# Patient Record
Sex: Female | Born: 1950 | Race: White | Hispanic: No | Smoking: Never smoker
Health system: Southern US, Community
[De-identification: ages and names within clinical notes are randomized; demographics above are authoritative.]

## PROBLEM LIST (undated history)

## (undated) DIAGNOSIS — K2270 Barrett's esophagus without dysplasia: Secondary | ICD-10-CM

## (undated) DIAGNOSIS — Z8719 Personal history of other diseases of the digestive system: Secondary | ICD-10-CM

## (undated) DIAGNOSIS — M1990 Unspecified osteoarthritis, unspecified site: Secondary | ICD-10-CM

## (undated) DIAGNOSIS — I10 Essential (primary) hypertension: Secondary | ICD-10-CM

## (undated) DIAGNOSIS — F419 Anxiety disorder, unspecified: Secondary | ICD-10-CM

## (undated) DIAGNOSIS — R011 Cardiac murmur, unspecified: Secondary | ICD-10-CM

## (undated) DIAGNOSIS — N189 Chronic kidney disease, unspecified: Secondary | ICD-10-CM

## (undated) DIAGNOSIS — F329 Major depressive disorder, single episode, unspecified: Secondary | ICD-10-CM

## (undated) DIAGNOSIS — F32A Depression, unspecified: Secondary | ICD-10-CM

## (undated) DIAGNOSIS — E079 Disorder of thyroid, unspecified: Secondary | ICD-10-CM

## (undated) DIAGNOSIS — K219 Gastro-esophageal reflux disease without esophagitis: Secondary | ICD-10-CM

## (undated) DIAGNOSIS — G43909 Migraine, unspecified, not intractable, without status migrainosus: Secondary | ICD-10-CM

## (undated) DIAGNOSIS — J189 Pneumonia, unspecified organism: Secondary | ICD-10-CM

## (undated) HISTORY — PX: ROTATOR CUFF REPAIR: SHX139

## (undated) HISTORY — PX: TONSILLECTOMY: SUR1361

## (undated) HISTORY — PX: EYE SURGERY: SHX253

## (undated) HISTORY — PX: JOINT REPLACEMENT: SHX530

## (undated) HISTORY — PX: REPLACEMENT TOTAL KNEE: SUR1224

---

## 2008-01-15 ENCOUNTER — Encounter: Payer: Self-pay | Admitting: Endocrinology

## 2008-03-17 ENCOUNTER — Ambulatory Visit: Payer: Self-pay | Admitting: Endocrinology

## 2008-03-17 DIAGNOSIS — F329 Major depressive disorder, single episode, unspecified: Secondary | ICD-10-CM

## 2008-03-17 DIAGNOSIS — J45909 Unspecified asthma, uncomplicated: Secondary | ICD-10-CM | POA: Insufficient documentation

## 2008-03-17 DIAGNOSIS — R011 Cardiac murmur, unspecified: Secondary | ICD-10-CM

## 2008-03-17 DIAGNOSIS — E785 Hyperlipidemia, unspecified: Secondary | ICD-10-CM

## 2008-03-17 DIAGNOSIS — E0590 Thyrotoxicosis, unspecified without thyrotoxic crisis or storm: Secondary | ICD-10-CM | POA: Insufficient documentation

## 2008-03-17 DIAGNOSIS — K219 Gastro-esophageal reflux disease without esophagitis: Secondary | ICD-10-CM

## 2008-03-25 ENCOUNTER — Encounter: Payer: Self-pay | Admitting: Endocrinology

## 2008-04-02 ENCOUNTER — Telehealth (INDEPENDENT_AMBULATORY_CARE_PROVIDER_SITE_OTHER): Payer: Self-pay | Admitting: *Deleted

## 2008-04-04 ENCOUNTER — Encounter (INDEPENDENT_AMBULATORY_CARE_PROVIDER_SITE_OTHER): Payer: Self-pay | Admitting: *Deleted

## 2008-04-07 ENCOUNTER — Telehealth: Payer: Self-pay | Admitting: Endocrinology

## 2008-04-23 ENCOUNTER — Encounter: Payer: Self-pay | Admitting: Endocrinology

## 2008-04-28 ENCOUNTER — Ambulatory Visit: Payer: Self-pay | Admitting: Endocrinology

## 2008-04-28 DIAGNOSIS — E042 Nontoxic multinodular goiter: Secondary | ICD-10-CM

## 2008-06-02 ENCOUNTER — Ambulatory Visit: Payer: Self-pay | Admitting: Endocrinology

## 2008-06-02 LAB — CONVERTED CEMR LAB
Free T4: 1 ng/dL (ref 0.6–1.6)
TSH: 0.03 microintl units/mL — ABNORMAL LOW (ref 0.35–5.50)

## 2008-06-25 ENCOUNTER — Telehealth: Payer: Self-pay | Admitting: Endocrinology

## 2008-08-05 ENCOUNTER — Ambulatory Visit: Payer: Self-pay | Admitting: Endocrinology

## 2008-08-05 LAB — CONVERTED CEMR LAB
Free T4: 0.6 ng/dL (ref 0.6–1.6)
TSH: 0.55 microintl units/mL (ref 0.35–5.50)

## 2008-11-03 ENCOUNTER — Ambulatory Visit: Payer: Self-pay | Admitting: Endocrinology

## 2009-02-16 ENCOUNTER — Ambulatory Visit: Payer: Self-pay | Admitting: Endocrinology

## 2009-02-20 ENCOUNTER — Telehealth (INDEPENDENT_AMBULATORY_CARE_PROVIDER_SITE_OTHER): Payer: Self-pay | Admitting: *Deleted

## 2009-07-10 ENCOUNTER — Encounter: Payer: Self-pay | Admitting: Endocrinology

## 2009-08-17 ENCOUNTER — Ambulatory Visit: Payer: Self-pay | Admitting: Endocrinology

## 2009-08-17 ENCOUNTER — Telehealth (INDEPENDENT_AMBULATORY_CARE_PROVIDER_SITE_OTHER): Payer: Self-pay | Admitting: *Deleted

## 2010-02-18 ENCOUNTER — Ambulatory Visit: Payer: Self-pay | Admitting: Endocrinology

## 2010-11-09 NOTE — Assessment & Plan Note (Signed)
Summary: 6 MTH FU  STC---RS'D/CD   Vital Signs:  Patient profile:   60 year old female Height:      60 inches (152.40 cm) Weight:      237.25 pounds (107.84 kg) BMI:     46.50 O2 Sat:      98 % on Room air Temp:     97.1 degrees F (36.17 degrees C) oral Pulse rate:   72 / minute BP sitting:   108 / 60  (left arm) Cuff size:   large  Vitals Entered By: Josph Macho RMA (Feb 18, 2010 10:41 AM)  O2 Flow:  Room air CC: 6 month follow up/ pt states she is no longer taking Nexium, Darvocet, Naproxen, Toviaz, or bystolic/ pt states she has developed Ulcers since her last visit/ CF   Primary Provider:  furr  CC:  6 month follow up/ pt states she is no longer taking Nexium, Darvocet, Naproxen, Toviaz, and or bystolic/ pt states she has developed Ulcers since her last visit/ CF.  History of Present Illness: pt states she has anxiety and insomnia.  she wants to reconsider i-131 rx.  she did not have enough uptake on scan in the past, but she would like to revisit.    Current Medications (verified): 1)  Singulair 10 Mg  Tabs (Montelukast Sodium) .... Take 1 By Mouth Qd 2)  Nexium 40 Mg  Cpdr (Esomeprazole Magnesium) .... Take 1 By Mouth Qd 3)  Lexapro 20 Mg  Tabs (Escitalopram Oxalate) .... Take 1 By Mouth Qd 4)  Actonel 150 Mg  Tabs (Risedronate Sodium) .... Take 1 By Mouth Q Month 5)  Darvocet-N 100 100-650 Mg  Tabs (Propoxyphene N-Apap) .... Take Prn 6)  Advair Diskus 500-50 Mcg/dose  Misc (Fluticasone-Salmeterol) .... Take 1 Puff Two Times A Day 7)  Tapazole 10 Mg  Tabs (Methimazole) .... Qd 8)  Naproxen 500 Mg Tabs (Naproxen) .... Take 1 By Mouth Two Times A Day 9)  Toviaz 8 Mg Xr24h-Tab (Fesoterodine Fumarate) .Marland Kitchen.. 1 Qd 10)  Bystolic 5 Mg Tabs (Nebivolol Hcl) .Marland Kitchen.. 1 Daily 11)  Lisinopril 10 Mg Tabs (Lisinopril) .... Once Daily 12)  Aciphex 20 Mg Tbec (Rabeprazole Sodium) .... Daily 13)  Carafate .Marland Kitchen.. 10 Mg Three Times A Day 14)  Vicodin 5-500 Mg Tabs (Hydrocodone-Acetaminophen)  .... 1/2 Tab Q 4 Hrs As Needed 15)  Fish Oil 16)  Osteo Bi-Flex .... Daily 17)  Aspirin 81mg  .... Daily 18)  Zyrtec .... As Needed  Allergies (verified): No Known Drug Allergies  Past History:  Past Medical History: Last updated: 03/17/2008 Asthma Depression GERD Hyperlipidemia  Review of Systems  The patient denies depression.    Physical Exam  General:  normal appearance.   Neck:  i do not appreciate a goiter. Additional Exam:  FastTSH                   0.46 uIU/mL                 0.35-5.50 Free T4                   0.9 ng/dL                   6.2-9.5    Impression & Recommendations:  Problem # 1:  GOITER, MULTINODULAR (ICD-241.1) Assessment Unchanged  Problem # 2:  HYPERTHYROIDISM (ICD-242.90) due to #1  Medications Added to Medication List This Visit: 1)  Lisinopril 10 Mg Tabs (Lisinopril) .Marland KitchenMarland KitchenMarland Kitchen  Once daily 2)  Aciphex 20 Mg Tbec (Rabeprazole sodium) .... Daily 3)  Carafate  .Marland Kitchen.. 10 mg three times a day 4)  Vicodin 5-500 Mg Tabs (Hydrocodone-acetaminophen) .... 1/2 tab q 4 hrs as needed 5)  Fish Oil  6)  Osteo Bi-flex  .... Daily 7)  Aspirin 81mg   .... Daily 8)  Zyrtec  .... As needed  Other Orders: TLB-TSH (Thyroid Stimulating Hormone) (84443-TSH) TLB-T4 (Thyrox), Free [phone]) Est. Patient Level III (91478)  Patient Instructions: 1)  tests are being ordered for you today.  a few days after the test(s), please call [phone] to hear your test results. 2)  pending the test results, please stop methimazole. 3)  when you thyroid becomes overactive again off the medication, plan will be to recheck the "scan," to see if the radioactive iodine is feasable. 4)  (update: i left message on phone-tree:  stay-off tapazole.  go to lab in 1 month for tsh and free t4.  242.9)

## 2011-10-07 ENCOUNTER — Encounter (HOSPITAL_COMMUNITY): Payer: Self-pay | Admitting: Certified Registered Nurse Anesthetist

## 2011-10-07 ENCOUNTER — Emergency Department (HOSPITAL_COMMUNITY): Payer: Managed Care, Other (non HMO) | Admitting: Certified Registered Nurse Anesthetist

## 2011-10-07 ENCOUNTER — Emergency Department (HOSPITAL_COMMUNITY): Payer: Managed Care, Other (non HMO)

## 2011-10-07 ENCOUNTER — Inpatient Hospital Stay (HOSPITAL_COMMUNITY)
Admission: EM | Admit: 2011-10-07 | Discharge: 2011-10-09 | DRG: 512 | Disposition: A | Discharge status: Home / Self Care | Payer: Managed Care, Other (non HMO) | Attending: Orthopaedic Surgery | Admitting: Orthopaedic Surgery

## 2011-10-07 ENCOUNTER — Ambulatory Visit: Admit: 2011-10-07 | Payer: Self-pay | Admitting: Orthopaedic Surgery

## 2011-10-07 ENCOUNTER — Encounter (HOSPITAL_COMMUNITY)
Admission: EM | Disposition: A | Discharge status: Home / Self Care | Payer: Self-pay | Source: Home / Self Care | Attending: Orthopaedic Surgery

## 2011-10-07 DIAGNOSIS — S52609B Unspecified fracture of lower end of unspecified ulna, initial encounter for open fracture type I or II: Secondary | ICD-10-CM

## 2011-10-07 DIAGNOSIS — S52201B Unspecified fracture of shaft of right ulna, initial encounter for open fracture type I or II: Secondary | ICD-10-CM | POA: Diagnosis present

## 2011-10-07 DIAGNOSIS — Y998 Other external cause status: Secondary | ICD-10-CM

## 2011-10-07 DIAGNOSIS — W540XXA Bitten by dog, initial encounter: Secondary | ICD-10-CM | POA: Diagnosis present

## 2011-10-07 DIAGNOSIS — I10 Essential (primary) hypertension: Secondary | ICD-10-CM | POA: Diagnosis present

## 2011-10-07 DIAGNOSIS — Y92009 Unspecified place in unspecified non-institutional (private) residence as the place of occurrence of the external cause: Secondary | ICD-10-CM

## 2011-10-07 DIAGNOSIS — S51859A Open bite of unspecified forearm, initial encounter: Secondary | ICD-10-CM | POA: Diagnosis present

## 2011-10-07 DIAGNOSIS — Z96659 Presence of unspecified artificial knee joint: Secondary | ICD-10-CM

## 2011-10-07 DIAGNOSIS — E079 Disorder of thyroid, unspecified: Secondary | ICD-10-CM | POA: Diagnosis present

## 2011-10-07 DIAGNOSIS — J45909 Unspecified asthma, uncomplicated: Secondary | ICD-10-CM | POA: Diagnosis present

## 2011-10-07 DIAGNOSIS — K2270 Barrett's esophagus without dysplasia: Secondary | ICD-10-CM | POA: Diagnosis present

## 2011-10-07 DIAGNOSIS — E0590 Thyrotoxicosis, unspecified without thyrotoxic crisis or storm: Secondary | ICD-10-CM | POA: Diagnosis present

## 2011-10-07 DIAGNOSIS — S41112A Laceration without foreign body of left upper arm, initial encounter: Secondary | ICD-10-CM

## 2011-10-07 DIAGNOSIS — S52209B Unspecified fracture of shaft of unspecified ulna, initial encounter for open fracture type I or II: Principal | ICD-10-CM | POA: Diagnosis present

## 2011-10-07 DIAGNOSIS — Z23 Encounter for immunization: Secondary | ICD-10-CM

## 2011-10-07 DIAGNOSIS — S51809A Unspecified open wound of unspecified forearm, initial encounter: Secondary | ICD-10-CM | POA: Diagnosis present

## 2011-10-07 DIAGNOSIS — T07XXXA Unspecified multiple injuries, initial encounter: Secondary | ICD-10-CM

## 2011-10-07 DIAGNOSIS — M19019 Primary osteoarthritis, unspecified shoulder: Secondary | ICD-10-CM | POA: Diagnosis present

## 2011-10-07 HISTORY — DX: Barrett's esophagus without dysplasia: K22.70

## 2011-10-07 HISTORY — PX: ORIF ULNAR FRACTURE: SHX5417

## 2011-10-07 HISTORY — DX: Essential (primary) hypertension: I10

## 2011-10-07 HISTORY — DX: Disorder of thyroid, unspecified: E07.9

## 2011-10-07 HISTORY — PX: I&D EXTREMITY: SHX5045

## 2011-10-07 LAB — CBC
HCT: 13.9 % — ABNORMAL LOW (ref 36.0–46.0)
HCT: 33.3 % — ABNORMAL LOW (ref 36.0–46.0)
Hemoglobin: 4.5 g/dL — CL (ref 12.0–15.0)
Hemoglobin: 10.7 g/dL — ABNORMAL LOW (ref 12.0–15.0)
MCH: 28.3 pg (ref 26.0–34.0)
MCH: 28.2 pg (ref 26.0–34.0)
MCHC: 32.4 g/dL (ref 30.0–36.0)
MCHC: 32.1 g/dL (ref 30.0–36.0)
MCV: 87.4 fL (ref 78.0–100.0)
MCV: 87.6 fL (ref 78.0–100.0)
Platelets: 363 10*3/uL (ref 150–400)
Platelets: 220 10*3/uL (ref 150–400)
RBC: 1.59 MIL/uL — ABNORMAL LOW (ref 3.87–5.11)
RBC: 3.8 MIL/uL — ABNORMAL LOW (ref 3.87–5.11)
RDW: 14.2 % (ref 11.5–15.5)
RDW: 14 % (ref 11.5–15.5)
WBC: 13.8 10*3/uL — ABNORMAL HIGH (ref 4.0–10.5)
WBC: 11 10*3/uL — ABNORMAL HIGH (ref 4.0–10.5)

## 2011-10-07 LAB — BASIC METABOLIC PANEL
BUN: 19 mg/dL (ref 6–23)
CO2: 22 mEq/L (ref 19–32)
Calcium: 8.7 mg/dL (ref 8.4–10.5)
Chloride: 109 mEq/L (ref 96–112)
Creatinine, Ser: 1.2 mg/dL — ABNORMAL HIGH (ref 0.50–1.10)
GFR calc Af Amer: 55 mL/min — ABNORMAL LOW (ref >90)
GFR calc non Af Amer: 47 mL/min — ABNORMAL LOW (ref >90)
Glucose, Bld: 89 mg/dL (ref 70–99)
Potassium: 4 mEq/L (ref 3.5–5.1)
Sodium: 141 mEq/L (ref 135–145)

## 2011-10-07 LAB — PROTIME-INR
INR: 1.03 (ref 0.00–1.49)
Prothrombin Time: 13.7 seconds (ref 11.6–15.2)

## 2011-10-07 LAB — TYPE AND SCREEN
ABO/RH(D): O POS
Antibody Screen: NEGATIVE

## 2011-10-07 LAB — ABO/RH: ABO/RH(D): O POS

## 2011-10-07 SURGERY — OPEN REDUCTION INTERNAL FIXATION (ORIF) ULNAR FRACTURE
Anesthesia: General | Site: Arm Lower | Laterality: Right | Wound class: Dirty or Infected

## 2011-10-07 MED ORDER — OLMESARTAN MEDOXOMIL-HCTZ 20-12.5 MG PO TABS: 1.0000 | ORAL_TABLET | Freq: Every day | ORAL | Status: DC

## 2011-10-07 MED ORDER — ONDANSETRON HCL 4 MG/2ML IJ SOLN
4.0000 mg | Freq: Once | INTRAMUSCULAR | Status: AC
  Administered 2011-10-07: 4 mg via INTRAVENOUS
  Filled 2011-10-07: qty 2

## 2011-10-07 MED ORDER — FENTANYL CITRATE 0.05 MG/ML IJ SOLN
INTRAMUSCULAR | Status: DC | PRN
  Administered 2011-10-07: 100 ug via INTRAVENOUS
  Administered 2011-10-07: 50 ug via INTRAVENOUS

## 2011-10-07 MED ORDER — HYDROMORPHONE HCL PF 1 MG/ML IJ SOLN
1.0000 mg | Freq: Once | INTRAMUSCULAR | Status: AC
  Administered 2011-10-07: 1 mg via INTRAVENOUS
  Filled 2011-10-07 (×2): qty 1

## 2011-10-07 MED ORDER — ONDANSETRON HCL 4 MG/2ML IJ SOLN
INTRAMUSCULAR | Status: AC
  Filled 2011-10-07: qty 2

## 2011-10-07 MED ORDER — PROMETHAZINE HCL 25 MG/ML IJ SOLN: 6.2500 mg | INTRAMUSCULAR | Status: DC | PRN

## 2011-10-07 MED ORDER — MORPHINE SULFATE (PF) 1 MG/ML IV SOLN
INTRAVENOUS | Status: DC
  Administered 2011-10-07 (×2): via INTRAVENOUS
  Administered 2011-10-07: 9 mg via INTRAVENOUS
  Administered 2011-10-08: 23:00:00 via INTRAVENOUS
  Administered 2011-10-08: 6 mg via INTRAVENOUS
  Administered 2011-10-08: 05:00:00 via INTRAVENOUS
  Administered 2011-10-08: 12 mL via INTRAVENOUS
  Administered 2011-10-08: 14:00:00 via INTRAVENOUS
  Administered 2011-10-08: 10.19 mL via INTRAVENOUS
  Administered 2011-10-08: 15 mg via INTRAVENOUS
  Administered 2011-10-08: 33 mg via INTRAVENOUS
  Administered 2011-10-09: 4.35 mg via INTRAVENOUS
  Filled 2011-10-07 (×5): qty 25

## 2011-10-07 MED ORDER — DIPHENHYDRAMINE HCL 12.5 MG/5ML PO ELIX
12.5000 mg | ORAL_SOLUTION | ORAL | Status: DC | PRN
  Filled 2011-10-07: qty 10

## 2011-10-07 MED ORDER — CEFAZOLIN SODIUM 1-5 GM-% IV SOLN
1.0000 g | Freq: Four times a day (QID) | INTRAVENOUS | Status: DC
  Administered 2011-10-07 – 2011-10-09 (×7): 1 g via INTRAVENOUS
  Filled 2011-10-07 (×8): qty 50

## 2011-10-07 MED ORDER — METOCLOPRAMIDE HCL 5 MG/ML IJ SOLN
5.0000 mg | Freq: Three times a day (TID) | INTRAMUSCULAR | Status: DC | PRN
  Filled 2011-10-07: qty 2

## 2011-10-07 MED ORDER — CLINDAMYCIN PHOSPHATE 900 MG/50ML IV SOLN
900.0000 mg | Freq: Once | INTRAVENOUS | Status: AC
  Administered 2011-10-07: 900 mg via INTRAVENOUS
  Filled 2011-10-07: qty 50

## 2011-10-07 MED ORDER — ESCITALOPRAM OXALATE 20 MG PO TABS
20.0000 mg | ORAL_TABLET | Freq: Every day | ORAL | Status: DC
  Administered 2011-10-07 – 2011-10-08 (×2): 20 mg via ORAL
  Filled 2011-10-07 (×3): qty 1

## 2011-10-07 MED ORDER — SENNOSIDES-DOCUSATE SODIUM 8.6-50 MG PO TABS: 1.0000 | ORAL_TABLET | Freq: Every evening | ORAL | Status: DC | PRN

## 2011-10-07 MED ORDER — HYDROCODONE-ACETAMINOPHEN 5-325 MG PO TABS: 1.0000 | ORAL_TABLET | ORAL | Status: DC | PRN

## 2011-10-07 MED ORDER — DIPHENHYDRAMINE HCL 50 MG/ML IJ SOLN
12.5000 mg | Freq: Four times a day (QID) | INTRAMUSCULAR | Status: DC | PRN
  Filled 2011-10-07: qty 0.25

## 2011-10-07 MED ORDER — NALOXONE HCL 0.4 MG/ML IJ SOLN
0.4000 mg | INTRAMUSCULAR | Status: DC | PRN
  Filled 2011-10-07: qty 1

## 2011-10-07 MED ORDER — METHOCARBAMOL 100 MG/ML IJ SOLN
500.0000 mg | Freq: Four times a day (QID) | INTRAVENOUS | Status: DC | PRN
  Administered 2011-10-07: 500 mg via INTRAVENOUS
  Filled 2011-10-07: qty 5

## 2011-10-07 MED ORDER — SODIUM CHLORIDE 0.9 % IR SOLN
Status: DC | PRN
  Administered 2011-10-07: 1000 mL

## 2011-10-07 MED ORDER — SODIUM CHLORIDE 0.9 % IJ SOLN: 9.0000 mL | INTRAMUSCULAR | Status: DC | PRN

## 2011-10-07 MED ORDER — TETANUS-DIPHTH-ACELL PERTUSSIS 5-2.5-18.5 LF-MCG/0.5 IM SUSP
0.5000 mL | Freq: Once | INTRAMUSCULAR | Status: DC
  Filled 2011-10-07: qty 0.5

## 2011-10-07 MED ORDER — LACTATED RINGERS IV SOLN
INTRAVENOUS | Status: DC | PRN
  Administered 2011-10-07 (×2): via INTRAVENOUS

## 2011-10-07 MED ORDER — TEMAZEPAM 15 MG PO CAPS
30.0000 mg | ORAL_CAPSULE | Freq: Every day | ORAL | Status: DC
Start: 2011-10-07 — End: 2011-10-09
  Administered 2011-10-08 (×2): 30 mg via ORAL
  Filled 2011-10-07 (×2): qty 2

## 2011-10-07 MED ORDER — BUPIVACAINE HCL (PF) 0.25 % IJ SOLN
INTRAMUSCULAR | Status: DC | PRN
  Administered 2011-10-07: 10 mL

## 2011-10-07 MED ORDER — DEXTROSE IN LACTATED RINGERS 5 % IV SOLN
INTRAVENOUS | Status: DC
  Administered 2011-10-07: 13:00:00 via INTRAVENOUS

## 2011-10-07 MED ORDER — ONDANSETRON HCL 4 MG PO TABS
4.0000 mg | ORAL_TABLET | Freq: Four times a day (QID) | ORAL | Status: DC | PRN
  Filled 2011-10-07: qty 1

## 2011-10-07 MED ORDER — MIDAZOLAM HCL 5 MG/5ML IJ SOLN
INTRAMUSCULAR | Status: DC | PRN
  Administered 2011-10-07: 1 mg via INTRAVENOUS

## 2011-10-07 MED ORDER — OLMESARTAN MEDOXOMIL 20 MG PO TABS
20.0000 mg | ORAL_TABLET | Freq: Every day | ORAL | Status: DC
  Administered 2011-10-08 – 2011-10-09 (×2): 20 mg via ORAL
  Filled 2011-10-07 (×2): qty 1

## 2011-10-07 MED ORDER — HYDROMORPHONE HCL PF 1 MG/ML IJ SOLN
0.2500 mg | INTRAMUSCULAR | Status: DC | PRN
  Administered 2011-10-07: 0.5 mg via INTRAVENOUS
  Administered 2011-10-07 (×2): 0.25 mg via INTRAVENOUS

## 2011-10-07 MED ORDER — METHOCARBAMOL 500 MG PO TABS
500.0000 mg | ORAL_TABLET | Freq: Four times a day (QID) | ORAL | Status: DC | PRN
  Administered 2011-10-08 – 2011-10-09 (×2): 500 mg via ORAL
  Filled 2011-10-07 (×3): qty 1

## 2011-10-07 MED ORDER — PHENYLEPHRINE HCL 10 MG/ML IJ SOLN
INTRAMUSCULAR | Status: DC | PRN
  Administered 2011-10-07 (×2): 40 ug via INTRAVENOUS

## 2011-10-07 MED ORDER — DOCUSATE SODIUM 100 MG PO CAPS
100.0000 mg | ORAL_CAPSULE | Freq: Two times a day (BID) | ORAL | Status: DC
  Administered 2011-10-08 – 2011-10-09 (×4): 100 mg via ORAL
  Filled 2011-10-07 (×5): qty 1

## 2011-10-07 MED ORDER — MEPERIDINE HCL 25 MG/ML IJ SOLN: 6.2500 mg | INTRAMUSCULAR | Status: DC | PRN

## 2011-10-07 MED ORDER — EPHEDRINE SULFATE 50 MG/ML IJ SOLN
INTRAMUSCULAR | Status: DC | PRN
  Administered 2011-10-07: 5 mg via INTRAVENOUS

## 2011-10-07 MED ORDER — ONDANSETRON HCL 4 MG/2ML IJ SOLN
INTRAMUSCULAR | Status: DC | PRN
  Administered 2011-10-07: 4 mg via INTRAVENOUS

## 2011-10-07 MED ORDER — ONDANSETRON HCL 4 MG/2ML IJ SOLN
4.0000 mg | Freq: Four times a day (QID) | INTRAMUSCULAR | Status: DC | PRN
  Filled 2011-10-07: qty 2

## 2011-10-07 MED ORDER — HYDROMORPHONE HCL PF 1 MG/ML IJ SOLN
1.0000 mg | Freq: Once | INTRAMUSCULAR | Status: AC
  Administered 2011-10-07: 1 mg via INTRAVENOUS
  Filled 2011-10-07: qty 1

## 2011-10-07 MED ORDER — KCL IN DEXTROSE-NACL 20-5-0.45 MEQ/L-%-% IV SOLN
INTRAVENOUS | Status: DC
  Administered 2011-10-07 – 2011-10-08 (×2): via INTRAVENOUS
  Filled 2011-10-07 (×3): qty 1000

## 2011-10-07 MED ORDER — OXYCODONE-ACETAMINOPHEN 5-325 MG PO TABS: 1.0000 | ORAL_TABLET | ORAL | Status: DC | PRN

## 2011-10-07 MED ORDER — CEFAZOLIN SODIUM-DEXTROSE 2-3 GM-% IV SOLR
2.0000 g | INTRAVENOUS | Status: AC
  Administered 2011-10-07: 2 g via INTRAVENOUS
  Filled 2011-10-07: qty 50

## 2011-10-07 MED ORDER — SODIUM CHLORIDE 0.9 % IV SOLN
Freq: Once | INTRAVENOUS | Status: AC
  Administered 2011-10-07: 09:00:00 via INTRAVENOUS

## 2011-10-07 MED ORDER — ONDANSETRON HCL 4 MG/2ML IJ SOLN
4.0000 mg | Freq: Four times a day (QID) | INTRAMUSCULAR | Status: DC | PRN
  Administered 2011-10-07: 4 mg via INTRAVENOUS
  Filled 2011-10-07: qty 2

## 2011-10-07 MED ORDER — DIPHENHYDRAMINE HCL 12.5 MG/5ML PO ELIX
12.5000 mg | ORAL_SOLUTION | Freq: Four times a day (QID) | ORAL | Status: DC | PRN
  Filled 2011-10-07: qty 5

## 2011-10-07 MED ORDER — METOCLOPRAMIDE HCL 10 MG PO TABS
5.0000 mg | ORAL_TABLET | Freq: Three times a day (TID) | ORAL | Status: DC | PRN
  Administered 2011-10-08: 10 mg via ORAL

## 2011-10-07 MED ORDER — PANTOPRAZOLE SODIUM 40 MG PO TBEC
40.0000 mg | DELAYED_RELEASE_TABLET | Freq: Every day | ORAL | Status: DC
  Administered 2011-10-07: 40 mg via ORAL
  Filled 2011-10-07: qty 1

## 2011-10-07 MED ORDER — LORAZEPAM 0.5 MG PO TABS
0.5000 mg | ORAL_TABLET | Freq: Four times a day (QID) | ORAL | Status: DC | PRN
  Administered 2011-10-08: 0.5 mg via ORAL
  Filled 2011-10-07: qty 1

## 2011-10-07 MED ORDER — HYDROCHLOROTHIAZIDE 12.5 MG PO CAPS
12.5000 mg | ORAL_CAPSULE | Freq: Every day | ORAL | Status: DC
  Administered 2011-10-08 – 2011-10-09 (×2): 12.5 mg via ORAL
  Filled 2011-10-07 (×2): qty 1

## 2011-10-07 MED ORDER — PROPOFOL 10 MG/ML IV EMUL
INTRAVENOUS | Status: DC | PRN
  Administered 2011-10-07: 150 mg via INTRAVENOUS

## 2011-10-07 MED ORDER — BISACODYL 10 MG RE SUPP: 10.0000 mg | Freq: Every day | RECTAL | Status: DC | PRN

## 2011-10-07 SURGICAL SUPPLY — 76 items
BAG DECANTER FOR FLEXI CONT (MISCELLANEOUS) IMPLANT
BANDAGE ACE 4 STERILE (GAUZE/BANDAGES/DRESSINGS) ×2 IMPLANT
BANDAGE ELASTIC 4 VELCRO ST LF (GAUZE/BANDAGES/DRESSINGS) ×2 IMPLANT
BANDAGE GAUZE ELAST BULKY 4 IN (GAUZE/BANDAGES/DRESSINGS) ×2 IMPLANT
BIT DRILL 2 FAST STEP (BIT) ×2 IMPLANT
BIT DRILL 2 MINI QC DISP (BIT) ×2 IMPLANT
BNDG COHESIVE 4X5 TAN STRL (GAUZE/BANDAGES/DRESSINGS) ×2 IMPLANT
BNDG ESMARK 4X9 LF (GAUZE/BANDAGES/DRESSINGS) ×2 IMPLANT
CLOTH BEACON ORANGE TIMEOUT ST (SAFETY) ×2 IMPLANT
CORDS BIPOLAR (ELECTRODE) ×2 IMPLANT
COVER SURGICAL LIGHT HANDLE (MISCELLANEOUS) ×2 IMPLANT
CUFF TOURNIQUET SINGLE 18IN (TOURNIQUET CUFF) ×2 IMPLANT
DRAPE OEC MINIVIEW 54X84 (DRAPES) ×2 IMPLANT
DRSG EMULSION OIL 3X3 NADH (GAUZE/BANDAGES/DRESSINGS) IMPLANT
DRSG PAD ABDOMINAL 8X10 ST (GAUZE/BANDAGES/DRESSINGS) ×4 IMPLANT
DURAPREP 26ML APPLICATOR (WOUND CARE) ×2 IMPLANT
ELECT REM PT RETURN 9FT ADLT (ELECTROSURGICAL) ×2
ELECTRODE REM PT RTRN 9FT ADLT (ELECTROSURGICAL) ×1 IMPLANT
GAUZE XEROFORM 1X8 LF (GAUZE/BANDAGES/DRESSINGS) ×2 IMPLANT
GAUZE XEROFORM 5X9 LF (GAUZE/BANDAGES/DRESSINGS) ×2 IMPLANT
GLOVE BIO SURGEON STRL SZ8.5 (GLOVE) ×4 IMPLANT
GLOVE BIOGEL PI IND STRL 7.5 (GLOVE) ×1 IMPLANT
GLOVE BIOGEL PI IND STRL 8 (GLOVE) ×1 IMPLANT
GLOVE BIOGEL PI INDICATOR 7.5 (GLOVE) ×1
GLOVE BIOGEL PI INDICATOR 8 (GLOVE) ×1
GLOVE ECLIPSE 7.0 STRL STRAW (GLOVE) IMPLANT
GLOVE ORTHO TXT STRL SZ7.5 (GLOVE) ×2 IMPLANT
GOWN PREVENTION PLUS LG XLONG (DISPOSABLE) IMPLANT
GOWN PREVENTION PLUS XLARGE (GOWN DISPOSABLE) ×4 IMPLANT
GOWN STRL NON-REIN LRG LVL3 (GOWN DISPOSABLE) ×4 IMPLANT
HANDPIECE INTERPULSE COAX TIP (DISPOSABLE) ×1
K-WIRE 1.4X100 (WIRE)
K-WIRE ACE 1.6X6 (WIRE) ×2
KIT BASIN OR (CUSTOM PROCEDURE TRAY) ×2 IMPLANT
KIT ROOM TURNOVER OR (KITS) ×2 IMPLANT
KWIRE 1.4X100 (WIRE) IMPLANT
KWIRE ACE 1.6X6 (WIRE) ×1 IMPLANT
MANIFOLD NEPTUNE II (INSTRUMENTS) ×2 IMPLANT
NEEDLE 22X1 1/2 (OR ONLY) (NEEDLE) IMPLANT
NEEDLE HYPO 25GX1X1/2 BEV (NEEDLE) ×2 IMPLANT
NS IRRIG 1000ML POUR BTL (IV SOLUTION) ×2 IMPLANT
PACK ORTHO EXTREMITY (CUSTOM PROCEDURE TRAY) ×2 IMPLANT
PAD ARMBOARD 7.5X6 YLW CONV (MISCELLANEOUS) ×4 IMPLANT
PAD CAST 4YDX4 CTTN HI CHSV (CAST SUPPLIES) IMPLANT
PADDING CAST COTTON 4X4 STRL (CAST SUPPLIES)
PADDING WEBRIL 4 STERILE (GAUZE/BANDAGES/DRESSINGS) ×2 IMPLANT
PLATE LOCKING 2.5 STRAIGHT (Plate) ×2 IMPLANT
SCREW PEG 15MM (Screw) ×6 IMPLANT
SCREW PEG 2.5X12 NONLOCK (Screw) ×2 IMPLANT
SCREW PEG 2.5X16 NONLOCK (Screw) ×2 IMPLANT
SCREW PEG LOCK 2.5X15 (Orthopedic Implant) ×10 IMPLANT
SCREW PEG LOCK 2.5X16 (Peg) ×2 IMPLANT
SCREWDRIVER BIT 2.0/2.5 127MM (BIT) ×4 IMPLANT
SET HNDPC FAN SPRY TIP SCT (DISPOSABLE) ×1 IMPLANT
SPONGE GAUZE 4X4 12PLY (GAUZE/BANDAGES/DRESSINGS) ×6 IMPLANT
SPONGE LAP 18X18 X RAY DECT (DISPOSABLE) IMPLANT
SPONGE LAP 4X18 X RAY DECT (DISPOSABLE) ×4 IMPLANT
STAPLER VISISTAT 35W (STAPLE) ×2 IMPLANT
STOCKINETTE IMPERVIOUS 9X36 MD (GAUZE/BANDAGES/DRESSINGS) ×2 IMPLANT
STRIP CLOSURE SKIN 1/2X4 (GAUZE/BANDAGES/DRESSINGS) IMPLANT
SUCTION FRAZIER TIP 10 FR DISP (SUCTIONS) ×2 IMPLANT
SUT ETHILON 4 0 PS 2 18 (SUTURE) IMPLANT
SUT PROLENE 3 0 PS 1 (SUTURE) ×2 IMPLANT
SUT VIC AB 2-0 CT1 27 (SUTURE)
SUT VIC AB 2-0 CT1 TAPERPNT 27 (SUTURE) IMPLANT
SUT VIC AB 3-0 X1 27 (SUTURE) ×2 IMPLANT
SUT VICRYL 4-0 PS2 18IN ABS (SUTURE) IMPLANT
SYR CONTROL 10ML LL (SYRINGE) ×2 IMPLANT
TOWEL OR 17X24 6PK STRL BLUE (TOWEL DISPOSABLE) ×2 IMPLANT
TOWEL OR 17X26 10 PK STRL BLUE (TOWEL DISPOSABLE) ×2 IMPLANT
TUBE ANAEROBIC SPECIMEN COL (MISCELLANEOUS) IMPLANT
TUBE CONNECTING 12X1/4 (SUCTIONS) ×2 IMPLANT
UNDERPAD 30X30 INCONTINENT (UNDERPADS AND DIAPERS) ×2 IMPLANT
WASHER 2.5 THREADED (Orthopedic Implant) ×2 IMPLANT
WATER STERILE IRR 1000ML POUR (IV SOLUTION) ×2 IMPLANT
YANKAUER SUCT BULB TIP NO VENT (SUCTIONS) IMPLANT

## 2011-10-07 NOTE — Transfer of Care (Signed)
Immediate Anesthesia Transfer of Care Note  Patient: Melinda Berger  Procedure(s) Performed:  OPEN REDUCTION INTERNAL FIXATION (ORIF) ULNAR FRACTURE; IRRIGATION AND DEBRIDEMENT EXTREMITY  Patient Location: PACU  Anesthesia Type: General  Level of Consciousness: sedated and responds to stimulation  Airway & Oxygen Therapy: Patient Spontanous Breathing and Patient connected to nasal cannula oxygen  Post-op Assessment: Report given to PACU RN, Post -op Vital signs reviewed and stable and Patient moving all extremities  Post vital signs: Reviewed and stable  Complications: No apparent anesthesia complications

## 2011-10-07 NOTE — Anesthesia Postprocedure Evaluation (Signed)
  Anesthesia Post-op Note  Patient: Melinda Berger  Procedure(s) Performed:  OPEN REDUCTION INTERNAL FIXATION (ORIF) ULNAR FRACTURE; IRRIGATION AND DEBRIDEMENT EXTREMITY  Patient Location: PACU  Anesthesia Type: General  Level of Consciousness: awake, alert  and oriented  Airway and Oxygen Therapy: Patient Spontanous Breathing and Patient connected to nasal cannula oxygen  Post-op Pain: mild  Post-op Assessment: Post-op Vital signs reviewed and Patient's Cardiovascular Status Stable  Post-op Vital Signs: stable  Complications: No apparent anesthesia complications

## 2011-10-07 NOTE — Anesthesia Procedure Notes (Signed)
Procedure Name: LMA Insertion Date/Time: 10/07/2011 1:40 PM Performed by: Ellin Goodie Pre-anesthesia Checklist: Patient identified, Emergency Drugs available, Suction available, Patient being monitored and Timeout performed Patient Re-evaluated:Patient Re-evaluated prior to inductionOxygen Delivery Method: Circle System Utilized Preoxygenation: Pre-oxygenation with 100% oxygen Intubation Type: IV induction Ventilation: Mask ventilation without difficulty LMA: LMA inserted LMA Size: 4.0 Number of attempts: 1 Placement Confirmation: positive ETCO2 and breath sounds checked- equal and bilateral Tube secured with: Tape Dental Injury: Teeth and Oropharynx as per pre-operative assessment

## 2011-10-07 NOTE — Brief Op Note (Signed)
10/07/2011  3:07 PM  PATIENT:  Melinda Berger  60 y.o. female  PRE-OPERATIVE DIAGNOSIS:  Right ulna shalf open fracture, open traumatic lacerations to right forearm Superficial lacerations to left forearm  POST-OPERATIVE DIAGNOSIS:  Right ulna shalf open fracture, open traumatic lacerations to right forearm Superficial lacerations to left forearm  PROCEDURE:  Procedure(s): OPEN REDUCTION INTERNAL FIXATION (ORIF) ULNAR FRACTURE IRRIGATION AND DEBRIDEMENT EXTREMITY IRRIGATION AND DEBRIDEMENT OF SUPERFICIAL LACERATIONS LEFT FOREARM  SURGEON:  Surgeon(s): Eldred Manges  PHYSICIAN ASSISTANT: Maud Deed PAC  ASSISTANTS: none   ANESTHESIA:   general  EBL:  Total I/O In: 1000 [I.V.:1000] Out: 300 [Urine:300]  BLOOD ADMINISTERED:none  DRAINS: none   LOCAL MEDICATIONS USED:  MARCAINE 10CC  SPECIMEN:  No Specimen  DISPOSITION OF SPECIMEN:  N/A  COUNTS:  YES  TOURNIQUET:  * Missing tourniquet times found for documented tourniquets in log:  16402 *                             Not used  DICTATION: .Note written in EPIC and Other Dictation: Dictation Number [id number]  PLAN OF CARE: Admit to inpatient   PATIENT DISPOSITION:  PACU - hemodynamically stable.   Delay start of Pharmacological VTE agent (>24hrs) due to surgical blood loss or risk of bleeding:  {YES/NO/NOT APPLICABLE:20182

## 2011-10-07 NOTE — Anesthesia Preprocedure Evaluation (Addendum)
Anesthesia Evaluation  Patient identified by MRN, date of birth, ID band Patient awake    Reviewed: Allergy & Precautions, H&P , NPO status , Patient's Chart, lab work & pertinent test results  Airway Mallampati: II TM Distance: >3 FB Neck ROM: full    Dental No notable dental hx. (+) Teeth Intact   Pulmonary neg pulmonary ROS,  clear to auscultation  Pulmonary exam normal       Cardiovascular hypertension, Pt. on medications neg cardio ROS + Valvular Problems/Murmurs regular Normal    Neuro/Psych PSYCHIATRIC DISORDERS Negative Neurological ROS  Negative Psych ROS   GI/Hepatic negative GI ROS, Neg liver ROS, GERD-  ,  Endo/Other  Negative Endocrine ROSHyperthyroidism   Renal/GU negative Renal ROS  Genitourinary negative   Musculoskeletal negative musculoskeletal ROS (+)   Abdominal   Peds negative pediatric ROS (+)  Hematology negative hematology ROS (+)   Anesthesia Other Findings   Reproductive/Obstetrics negative OB ROS                          Anesthesia Physical Anesthesia Plan  ASA: II and Emergent  Anesthesia Plan: General   Post-op Pain Management:    Induction: Intravenous  Airway Management Planned: LMA  Additional Equipment:   Intra-op Plan:   Post-operative Plan: Extubation in OR  Informed Consent: I have reviewed the patients History and Physical, chart, labs and discussed the procedure including the risks, benefits and alternatives for the proposed anesthesia with the patient or authorized representative who has indicated his/her understanding and acceptance.   Dental advisory given  Plan Discussed with: CRNA, Anesthesiologist and Surgeon  Anesthesia Plan Comments:         Anesthesia Quick Evaluation

## 2011-10-07 NOTE — ED Notes (Signed)
Pt reports attempting to break up her 2 [place] and was bit by one of the dogs. Pt has lacerations Right forearm and lacerations to left forearm.

## 2011-10-07 NOTE — Preoperative (Signed)
Beta Blockers   Reason not to administer Beta Blockers:Not Applicable 

## 2011-10-07 NOTE — ED Provider Notes (Signed)
History    60yF with dog bite. Attempting to break up fight between her great danes when was bitten. Laceration to R forearm, deformity and severe pain. No numbness or tingling. Skin tear to L forearm. Says lost a lot of blood prehospital. No other complaints. Denies use of blood thinning medication. No dizziness, lightheadedness or sob. Unsure of tetanus status. Dogs rabies vaccinations current.  CSN: [id number]  Arrival date & time 10/07/11  5621   First MD Initiated Contact with Patient 10/07/11 0818      Chief Complaint  Patient presents with  . Animal Bite  . Laceration    (Consider location/radiation/quality/duration/timing/severity/associated sxs/prior treatment) HPI  Past Medical History  Diagnosis Date  . Hypertension   . Asthma   . Barrett esophagus   . Thyroid disease     Past Surgical History  Procedure Date  . Tonsillectomy     History reviewed. No pertinent family history.  History  Substance Use Topics  . Smoking status: Never Smoker   . Smokeless tobacco: Never Used  . Alcohol Use: Yes    OB History    Grav Para Term Preterm Abortions TAB SAB Ect Mult Living                  Review of Systems   Review of symptoms negative unless otherwise noted in HPI.   Allergies  Ambien and Nsaids  Home Medications   Current Outpatient Rx  Name Route Sig Dispense Refill  . ESCITALOPRAM OXALATE 20 MG PO TABS Oral Take 20 mg by mouth at bedtime.      Marland Kitchen HYDROCODONE-ACETAMINOPHEN 5-325 MG PO TABS Oral Take 1 tablet by mouth every 6 (six) hours as needed. For pain     . LORAZEPAM 0.5 MG PO TABS Oral Take 0.5 mg by mouth every 6 (six) hours as needed. For anxiety     . OLMESARTAN MEDOXOMIL-HCTZ 20-12.5 MG PO TABS Oral Take 1 tablet by mouth daily.      Marland Kitchen RABEPRAZOLE SODIUM 20 MG PO TBEC Oral Take 20 mg by mouth daily.      Marland Kitchen TEMAZEPAM 30 MG PO CAPS Oral Take 30 mg by mouth at bedtime.        BP 113/37  Pulse 79  Temp(Src) 98 F (36.7 C) (Oral)  Resp  20  SpO2 100%  Physical Exam  Nursing note and vitals reviewed. Constitutional: She appears well-developed and well-nourished.       Sitting up in bed supporting R forearm with L hand  HENT:  Head: Normocephalic and atraumatic.  Eyes: Conjunctivae are normal. Right eye exhibits no discharge. Left eye exhibits no discharge.  Neck: Neck supple.  Cardiovascular: Normal rate, regular rhythm and normal heart sounds.  Exam reveals no gallop and no friction rub.   No murmur heard. Pulmonary/Chest: Effort normal and breath sounds normal. No respiratory distress.  Abdominal: Soft. She exhibits no distension. There is no tenderness.  Musculoskeletal:       Severe tenderness along distal R forearm. Neurovascularly intact distally.  Neurological: She is alert.  Skin: Skin is warm and dry. She is not diaphoretic.       8 cm roughly linear laceration with irregular margins to dorsal aspect of mid R forearm. 6 cm laceration to ulnar aspect distal R forearm. No active bleeding. Subcutaneous fat visualized. Multiple skin tears to L forearm.  Psychiatric: Thought content normal.       Slightly anxious    ED Course  Procedures (including critical  care time)  Labs Reviewed  CBC - Abnormal; Notable for the following:    WBC 13.8 (*)    RBC 1.59 (*)    Hemoglobin 4.5 (*)    HCT 13.9 (*)    All other components within normal limits  BASIC METABOLIC PANEL - Abnormal; Notable for the following:    Creatinine, Ser 1.20 (*)    GFR calc non Af Amer 47 (*)    GFR calc Af Amer 55 (*)    All other components within normal limits  CBC - Abnormal; Notable for the following:    WBC 11.0 (*)    RBC 3.80 (*)    Hemoglobin 10.7 (*) DELTA CHECK NOTED   HCT 33.3 (*)    All other components within normal limits  PROTIME-INR  TYPE AND SCREEN  ABO/RH  LAB REPORT - SCANNED   Dg Chest 2 View  10/07/2011  *RADIOLOGY REPORT*  Clinical Data: Preoperative respiratory exam for arm fracture  CHEST - 2 VIEW   Comparison: None.  Findings: Heart size is normal.  There is a large hiatal hernia. The lungs show prominent interstitial markings which are likely chronic.  No sign of active infiltrate, mass, effusion or collapse. No acute bony finding.  IMPRESSION: Hiatal hernia.  Chronic appearing interstitial lung markings.  Original Report Authenticated By: Thomasenia Sales, M.D.   Dg Forearm Right  10/07/2011  *RADIOLOGY REPORT*  Clinical Data: bitten by dog, evaluate for fracture  RIGHT FOREARM - 2 VIEW  Comparison: Right wrist radiographs - earlier same day  Findings: There is a comminuted minimally displaced fracture of the distal diaphysis of the ulna.  This is associated with adjacent subcutaneous edema.  No definite radiopaque foreign body.  Punctate calcifications are favored to be dermal in etiology.  There is mild subluxation of the distal radial ulnar joint.  IMPRESSION: 1.  Comminuted, minimally displaced fracture the distal diaphysis of the ulna. 2.  Extensive subcutaneous edema about the fracture site without discrete radiopaque foreign body.  Original Report Authenticated By: Waynard Reeds, M.D.   Dg Wrist Complete Right  10/07/2011  *RADIOLOGY REPORT*  Clinical Data: Bitten by dog, evaluate for fracture  RIGHT WRIST - COMPLETE 3+ VIEW  Comparison: None.  Findings:  There is a comminuted, displaced fracture of the distal diaphysis of the ulna with volar angulation and minimal overriding of the fracture fragments.  There is minimal subluxation of the distal radial ulnar joint.   There is an extensive amount of adjacent subcutaneous swelling.  No subcutaneous emphysema or radiopaque foreign body.  Punctate calcifications within the mid aspect of the arm may be dermal in etiology.  IMPRESSION: 1.  Comminuted, displaced fracture of the distal diaphysis of the ulna with subluxation of the distal radial ulnar joint.  2.  Extensive subcutaneous edema about the fracture site without discrete radiopaque foreign  body.  Original Report Authenticated By: Waynard Reeds, M.D.   9:56 AM XR reviewed by myself. Comminuted distal ulnar fx. Hand surgery paged. 10:10 AM Discussed case with Yates, hand surgery. Will review films and eval pt in ED.  1. Open fracture of distal ulna   2. Dog bite   3. Lacerations of multiple sites of left arm   4. Abrasions of multiple sites       MDM  60yF with dog bites. Multiple wounds. Open fx to R ulna. Discussed with ortho and to take to OR for washout and ORIF. Dose abx in ED, pain  meds. Last tetanus in 2009.       Raeford Razor, MD 10/12/11 1415

## 2011-10-07 NOTE — ED Notes (Signed)
Received call from lab Re: HGB 4.5 results given to Dr. Juleen China. Pt out of rm for testing.

## 2011-10-07 NOTE — H&P (Signed)
Melinda Berger is an 60 y.o. female.   Chief Complaint: Dog bite to right forearm HPI: Pt report she was breaking up a fight between two female [place] which are her dogs.  One dog bit her right forearm and she sustained an open comminuted fracture to the right ulna.  Multiple small abrasions to the left forearm without fracture. Pt treated in ED with wound care and IV Clindamycin.  Admitted for ORIF of open ulna fracture by Dr Ophelia Charter. Last tetanus 2009 per pt.  Pt indicated the dogs vaccinations are up to date  Of note, Pt has severe OA of right shoulder and plans for TSR at future date.  History of Left total knee replacement. PCP and orthopedist in [facility]  Past Medical History  Diagnosis Date  . Hypertension   . Asthma   . Barrett esophagus   . Thyroid disease     Past Surgical History  Procedure Date  . Tonsillectomy     History reviewed. No pertinent family history. Social History:  reports that she has never smoked. She has never used smokeless tobacco. She reports that she drinks alcohol. She reports that she does not use illicit drugs.  Allergies:  Allergies  Allergen Reactions  . Ambien Other (See Comments)    Hallucinations   . Nsaids     Due to barret esophagus    Medications Prior to Admission  Medication Dose Route Frequency Provider Last Rate Last Dose  . 0.9 %  sodium chloride infusion   Intravenous Once Raeford Razor, MD      . clindamycin (CLEOCIN) IVPB 900 mg  900 mg Intravenous Once Raeford Razor, MD   900 mg at 10/07/11 1030  . HYDROmorphone (DILAUDID) injection 1 mg  1 mg Intravenous Once Raeford Razor, MD   1 mg at 10/07/11 0841  . HYDROmorphone (DILAUDID) injection 1 mg  1 mg Intravenous Once Raeford Razor, MD   1 mg at 10/07/11 0902  . HYDROmorphone (DILAUDID) injection 1 mg  1 mg Intravenous Once Raeford Razor, MD   1 mg at 10/07/11 1042  . ondansetron (ZOFRAN) injection 4 mg  4 mg Intravenous Once Raeford Razor, MD   4 mg at 10/07/11 0840  .  DISCONTD: TDaP (BOOSTRIX) injection 0.5 mL  0.5 mL Intramuscular Once Raeford Razor, MD       No current outpatient prescriptions on file as of 10/07/2011.    Results for orders placed during the hospital encounter of 10/07/11 (from the past 48 hour(s))  CBC     Status: Abnormal   Collection Time   10/07/11  8:53 AM      Component Value Range Comment   WBC 13.8 (*) 4.0 - 10.5 (K/uL)    RBC 1.59 (*) 3.87 - 5.11 (MIL/uL)    Hemoglobin 4.5 (*) 12.0 - 15.0 (g/dL)    HCT 16.1 (*) 09.6 - 46.0 (%)    MCV 87.4  78.0 - 100.0 (fL)    MCH 28.3  26.0 - 34.0 (pg)    MCHC 32.4  30.0 - 36.0 (g/dL)    RDW 04.5  40.9 - 81.1 (%)    Platelets 363  150 - 400 (K/uL)   BASIC METABOLIC PANEL     Status: Abnormal   Collection Time   10/07/11  8:53 AM      Component Value Range Comment   Sodium 141  135 - 145 (mEq/L)    Potassium 4.0  3.5 - 5.1 (mEq/L)    Chloride 109  96 - 112 (mEq/L)    CO2 22  19 - 32 (mEq/L)    Glucose, Bld 89  70 - 99 (mg/dL)    BUN 19  6 - 23 (mg/dL)    Creatinine, Ser 0.45 (*) 0.50 - 1.10 (mg/dL)    Calcium 8.7  8.4 - 10.5 (mg/dL)    GFR calc non Af Amer 47 (*) >90 (mL/min)    GFR calc Af Amer 55 (*) >90 (mL/min)   CBC     Status: Abnormal   Collection Time   10/07/11  9:56 AM      Component Value Range Comment   WBC 11.0 (*) 4.0 - 10.5 (K/uL)    RBC 3.80 (*) 3.87 - 5.11 (MIL/uL)    Hemoglobin 10.7 (*) 12.0 - 15.0 (g/dL) DELTA CHECK NOTED   HCT 33.3 (*) 36.0 - 46.0 (%)    MCV 87.6  78.0 - 100.0 (fL)    MCH 28.2  26.0 - 34.0 (pg)    MCHC 32.1  30.0 - 36.0 (g/dL)    RDW 40.9  81.1 - 91.4 (%)    Platelets 220  150 - 400 (K/uL)   PROTIME-INR     Status: Normal   Collection Time   10/07/11  9:56 AM      Component Value Range Comment   Prothrombin Time 13.7  11.6 - 15.2 (seconds)    INR 1.03  0.00 - 1.49     Dg Forearm Right  10/07/2011  *RADIOLOGY REPORT*  Clinical Data: bitten by dog, evaluate for fracture  RIGHT FOREARM - 2 VIEW  Comparison: Right wrist radiographs  - earlier same day  Findings: There is a comminuted minimally displaced fracture of the distal diaphysis of the ulna.  This is associated with adjacent subcutaneous edema.  No definite radiopaque foreign body.  Punctate calcifications are favored to be dermal in etiology.  There is mild subluxation of the distal radial ulnar joint.  IMPRESSION: 1.  Comminuted, minimally displaced fracture the distal diaphysis of the ulna. 2.  Extensive subcutaneous edema about the fracture site without discrete radiopaque foreign body.  Original Report Authenticated By: Waynard Reeds, M.D.   Dg Wrist Complete Right  10/07/2011  *RADIOLOGY REPORT*  Clinical Data: Bitten by dog, evaluate for fracture  RIGHT WRIST - COMPLETE 3+ VIEW  Comparison: None.  Findings:  There is a comminuted, displaced fracture of the distal diaphysis of the ulna with volar angulation and minimal overriding of the fracture fragments.  There is minimal subluxation of the distal radial ulnar joint.   There is an extensive amount of adjacent subcutaneous swelling.  No subcutaneous emphysema or radiopaque foreign body.  Punctate calcifications within the mid aspect of the arm may be dermal in etiology.  IMPRESSION: 1.  Comminuted, displaced fracture of the distal diaphysis of the ulna with subluxation of the distal radial ulnar joint.  2.  Extensive subcutaneous edema about the fracture site without discrete radiopaque foreign body.  Original Report Authenticated By: Waynard Reeds, M.D.    Review of Systems  Constitutional: Negative.   HENT: Negative.   Eyes: Negative.   Respiratory: Negative.   Cardiovascular: Negative.   Gastrointestinal: Negative.   Genitourinary: Negative.   Musculoskeletal:       Pain in right forearm  Skin:       Laceration to right arm and abrasions to left arm  Neurological: Positive for tingling.       In fingertips of right hand  Endo/Heme/Allergies:  Negative.   Psychiatric/Behavioral: Negative.     Blood  pressure 148/58, pulse 76, temperature 98.1 F (36.7 C), temperature source Oral, resp. rate 18, SpO2 100.00%. Physical Exam  Constitutional: She appears well-developed and well-nourished. She appears distressed.       Distressed secondary to pain in right forearm  HENT:  Head: Normocephalic and atraumatic.  Eyes: EOM are normal. Pupils are equal, round, and reactive to light.  Neck: Normal range of motion. Neck supple.  Cardiovascular: Normal rate and regular rhythm.   Respiratory: Effort normal and breath sounds normal.  GI: Soft.  Musculoskeletal:       Open fracture of right ulna with laceration of lateral right arm and avulsion of the skin of dorsal forearm.  Deformity of forearm.  Moving fingers right hand well with good cap refill.  Subjective tingling of fingertips right.  Radial and ulnar nerve function intact right  Left forearm with several small abrasions dorsally     Assessment/Plan 1.  Dog bite to the right forearm with open right ulna fracture and large laceration 2.  Superficial abrasions to left forearm  PLAN  To OR for ORIF of open right ulna fracture with appropriate wound care to bilateral forearms.  IV Clindamycin started in ED.  Admit for continued abx and post op care  Etta Gassett M 10/07/2011, 11:27 AM

## 2011-10-08 MED ORDER — NON FORMULARY: 20.0000 mg | Freq: Every day | Status: DC

## 2011-10-08 MED ORDER — SIMETHICONE 80 MG PO CHEW
80.0000 mg | CHEWABLE_TABLET | Freq: Four times a day (QID) | ORAL | Status: DC | PRN
  Administered 2011-10-08: 80 mg via ORAL
  Filled 2011-10-08: qty 1

## 2011-10-08 MED ORDER — OXYCODONE-ACETAMINOPHEN 5-325 MG PO TABS
1.0000 | ORAL_TABLET | ORAL | Status: DC | PRN
  Administered 2011-10-08 – 2011-10-09 (×5): 1 via ORAL
  Filled 2011-10-08 (×5): qty 1

## 2011-10-08 MED ORDER — PNEUMOCOCCAL VAC POLYVALENT 25 MCG/0.5ML IJ INJ
0.5000 mL | INJECTION | INTRAMUSCULAR | Status: AC
  Filled 2011-10-08 (×2): qty 0.5

## 2011-10-08 MED ORDER — SUCRALFATE 1 GM/10ML PO SUSP
1.0000 g | Freq: Two times a day (BID) | ORAL | Status: DC
  Administered 2011-10-08 – 2011-10-09 (×2): 1 g via ORAL
  Filled 2011-10-08 (×3): qty 10

## 2011-10-08 MED ORDER — RABEPRAZOLE SODIUM 20 MG PO TBEC
20.0000 mg | DELAYED_RELEASE_TABLET | Freq: Every day | ORAL | Status: DC
  Administered 2011-10-09: 20 mg via ORAL

## 2011-10-08 NOTE — Progress Notes (Signed)
Occupational Therapy Evaluation Patient Details Name: Melinda Berger MRN: [id number] DOB: 1951/06/13 Today's Date: 10/08/2011  Problem List:  Patient Active Problem List  Diagnoses  . GOITER, MULTINODULAR  . HYPERTHYROIDISM  . HYPERLIPIDEMIA  . DEPRESSION  . ASTHMA  . GERD  . CARDIAC MURMUR  . Barrett's esophagus  . Fracture of ulna, right, open  . Dog bite of forearm  . Multiple abrasions    Past Medical History:  Past Medical History  Diagnosis Date  . Hypertension   . Asthma   . Barrett esophagus   . Thyroid disease    Past Surgical History:  Past Surgical History  Procedure Date  . Tonsillectomy     OT Assessment/Plan/Recommendation OT Assessment Clinical Impression Statement: This 60 y.o. female presents to OT s/p ulnar fx and ORIF and soft tissue wounds bil. UEs which limits bil. UE function.  Pt. demonstrates acute pain, decreased UE function, decrased balance and decreased independence with BADLs.  She has good family support who will assist as needed at D/C.  Recommend OT to maximize safety and independence with BADLS OT Recommendation/Assessment: Patient will need skilled OT in the acute care venue OT Problem List: Decreased strength;Decreased range of motion;Decreased activity tolerance;Impaired balance (sitting and/or standing);Decreased coordination;Impaired UE functional use;Pain Barriers to Discharge: None OT Therapy Diagnosis : Generalized weakness;Acute pain OT Plan OT Frequency: Min 2X/week OT Treatment/Interventions: Self-care/ADL training;Therapeutic exercise;Therapeutic activities;Patient/family education;Balance training OT Recommendation Follow Up Recommendations: Outpatient OT (at MD discretion for Rt UE) Equipment Recommended: None recommended by OT Individuals Consulted Consulted and Agree with Results and Recommendations: Patient OT Goals Acute Rehab OT Goals OT Goal Formulation: With patient Time For Goal Achievement: 7 days ADL  Goals Pt Will Perform Eating: with modified independence;with adaptive utensils ADL Goal: Eating - Progress: Not addressed Pt Will Perform Grooming: with supervision;Standing at sink ADL Goal: Grooming - Progress: Not met Pt Will Perform Upper Body Bathing: with min assist;Unsupported;Sitting, edge of bed ADL Goal: Upper Body Bathing - Progress: Not met Pt Will Perform Lower Body Bathing: with min assist;Sit to stand from bed ADL Goal: Lower Body Bathing - Progress: Not met Pt Will Perform Upper Body Dressing: with set-up;Unsupported;Sitting, bed ADL Goal: Upper Body Dressing - Progress: Not met Pt Will Perform Lower Body Dressing: with min assist;Sit to stand from bed ADL Goal: Lower Body Dressing - Progress: Not met Pt Will Transfer to Toilet: with supervision;Ambulation;Comfort height toilet ADL Goal: Toilet Transfer - Progress: Not met Pt Will Perform Toileting - Clothing Manipulation: with modified independence;Standing ADL Goal: Toileting - Clothing Manipulation - Progress: Not met Additional ADL Goal #1: Pt. will be independent with HEP for bil. UEs ADL Goal: Additional Goal #1 - Progress: Not met  OT Evaluation Precautions/Restrictions  Precautions Precaution Comments: Pt. with ulnar fracture and ORIF Prior Functioning Home Living Lives With: Spouse Bathroom Shower/Tub: Tub/shower unit;None Bathroom Toilet: Handicapped height Additional Comments: Husband and daughter will be available to assist as necessary Prior Function Level of Independence: Independent with basic ADLs;Independent with homemaking with ambulation;Independent with gait;Independent with transfers Able to Take Stairs?: Yes Driving: Yes Vocation: Full time employment Vocation Requirements: Pt is an Charity fundraiser with [employer] Comments: Pt is Rt hand dominant ADL ADL Eating/Feeding: Performed;Modified independent (with use of built of handles and lidded mug) Where Assessed - Eating/Feeding: Chair Grooming:  Performed;Wash/dry hands;Set up Where Assessed - Grooming: Unsupported;Sitting, bed Upper Body Bathing: Simulated;Maximal assistance Where Assessed - Upper Body Bathing: Unsupported;Sitting, bed Lower Body Bathing: Simulated;Maximal assistance Where Assessed - Lower  Body Bathing: Sit to stand from bed Upper Body Dressing: Simulated;Maximal assistance Where Assessed - Upper Body Dressing: Unsupported;Sitting, bed Lower Body Dressing: Simulated;+1 Total assistance Where Assessed - Lower Body Dressing: Sit to stand from bed Toilet Transfer: Performed;Minimal assistance Toilet Transfer Method: Stand pivot Toilet Transfer Equipment: Bedside commode Toileting - Clothing Manipulation: Simulated;Moderate assistance Where Assessed - Toileting Clothing Manipulation: Standing Toileting - Hygiene: Performed;Set up Where Assessed - Toileting Hygiene: Sit on 3-in-1 or toilet Tub/Shower Transfer:  (Pt. plans to sponge bathe) ADL Comments: This 60 y.o. female presents to OT s/p ulnar fracture on Rt. and soft tissue wounds Lt. UE which intefere with her ability to perform ADLs.  Pt. with c/o "wooziness" and dizziness with standing.  BP check and was non-contributory.  Pt. currently requires min A for standing Vision/Perception    Cognition Cognition Arousal/Alertness: Awake/alert Overall Cognitive Status: Appears within functional limits for tasks assessed Orientation Level: Oriented X4 Sensation/Coordination Sensation Light Touch: Appears Intact (Pt. denies numbess and tingling) Coordination Gross Motor Movements are Fluid and Coordinated: Yes Fine Motor Movements are Fluid and Coordinated: Yes Extremity Assessment RUE Assessment RUE Assessment: Exceptions to [facility] RUE AROM (degrees) Overall AROM Right Upper Extremity: Deficits RUE Overall AROM Comments: Pt. with ulnar fx and s/p ORIF.  Pt. in post surgical splint with bulky dressing and ace wrap in place.  Pt. demonstrates full AROM finger  flexion and extension.  Pt. demonstrates difficulty fully extending digits due to pain, but able to do so with effort.  Pt. was encouraged to perform flexion and ext.  Pt. with full elbow flex and ext, but pain present.  Shoulder AROM limited to ~40 due to premorbid issues - plannning shoulder replacement summer 2012 LUE Assessment LUE Assessment: Exceptions to [facility] LUE AROM (degrees) Overall AROM Left Upper Extremity: Deficits LUE Overall AROM Comments: Pt. with soft tissue wound Lt. forearm with bulky dressing and ace wrap in place.  Pt demonstrates full AROM of hand, forearm and elbow, although pain present with finger flex and ext.  SHoulder limited to ~45-50 degrees due to premorbid issues Mobility  Bed Mobility Bed Mobility: Yes Supine to Sit: 5: Supervision Transfers Transfers: Yes Sit to Stand: 4: Min assist;With upper extremity assist Sit to Stand Details (indicate cue type and reason): Pt shaky and unsteady on her feet Stand to Sit: 4: Min assist;With upper extremity assist Exercises Shoulder Exercises Elbow Flexion: AROM;Both;10 reps;Supine Elbow Extension: AROM;Both;10 reps;Supine Digit Composite Flexion: AROM;Both;10 reps Composite Extension: AROM;Both;10 reps;Supine End of Session OT - End of Session Equipment Utilized During Treatment: Other (comment) (foam built up handles for utensils and lidded mug) Activity Tolerance: Patient limited by fatigue;Patient limited by pain Patient left: in chair;with call bell in reach;with family/visitor present (Rt. UE elevated) Nurse Communication: Mobility status for transfers General Behavior During Session: [facility] for tasks performed Cognition: [facility] for tasks performed   Nikia Levels, Ursula Alert M 10/08/2011, 7:22 PM

## 2011-10-08 NOTE — H&P (Signed)
Reviewed and agree.  Patient seen and examined. Plan as outlined.

## 2011-10-08 NOTE — Progress Notes (Signed)
Plan likely discharge in AM after 48 hrs ABX for open dog bite injury.

## 2011-10-08 NOTE — Progress Notes (Signed)
Subjective: This patient is postop day #1 from open reduction internal fixation of right open grade 2 ulnar fracture wounds secondary to dog bite. She is currently on IV antibiotics and PCA. Her level of pain is moderately severe. Required continued use of IV PCA. We will add to this oral Percocet one tablet every 4 hours. TKO IV fluids. . Objective: Vital signs in last 24 hours: Temp:  [97.4 F (36.3 C)-98.8 F (37.1 C)] 98.8 F (37.1 C) (12/29 0512) Pulse Rate:  [62-79] 79  (12/29 0512) Resp:  [14-24] 20  (12/29 0900) BP: (97-153)/(35-83) 121/44 mmHg (12/29 0512) SpO2:  [93 %-100 %] 95 % (12/29 0900) Weight:  [101.606 kg (224 lb)] 224 lb (101.606 kg) (12/28 2115)  Intake/Output from previous day: 12/28 0701 - 12/29 0700 In: 1400 [I.V.:1400] Out: 320 [Urine:300; Blood:20] Intake/Output this shift:     Basename 10/07/11 0956 10/07/11 0853  HGB 10.7* 4.5*    Basename 10/07/11 0956 10/07/11 0853  WBC 11.0* 13.8*  RBC 3.80* 1.59*  HCT 33.3* 13.9*  PLT 220 363    Basename 10/07/11 0853  NA 141  K 4.0  CL 109  CO2 22  BUN 19  CREATININE 1.20*  GLUCOSE 89  CALCIUM 8.7    Basename 10/07/11 0956  LABPT --  INR 1.03    Neurologically intact Neurovascular intact Sensation intact distally Intact pulses distally Incision: dressing C/D/I Fingers are pink and warm with normal sensation in the median and ulnar nerve and radial nerve distribution. [place] sling elevation.  Assessment/Plan: Postop day #1 from ORIF of right open grade 2 ulnar fracture secondary to dog bite. Multiple laceration from both arms. Pain control and IV antibiotics continued.  Plan:1) elevation and use of ice locally 2) continue IV antibiotic coverage for staph and possibly Eikenella. 3) allow by mouth narcotic medications in addition to PCA. 4) encourage movement of right hand fingers. 5) occupational therapy consult.  Kebin Maye E 10/08/2011, 9:30 AM

## 2011-10-08 NOTE — Op Note (Signed)
NAMETONJA, JEZEWSKI NO.:  [id number]  MEDICAL RECORD NO.:  [id number]  LOCATION:  5020                         FACILITY:  [facility]  PHYSICIAN:  Clemence Lengyel C. Ophelia Charter, M.D.    DATE OF BIRTH:  Aug 13, 1951  DATE OF PROCEDURE:  10/07/2011 DATE OF DISCHARGE:                              OPERATIVE REPORT   PREOPERATIVE DIAGNOSIS:  Right grade 2 open distal ulna comminuted shaft fracture secondary to a dog bite with multiple forearm lacerations bilateral.  POSTOPERATIVE DIAGNOSIS:  Right grade 2 open distal ulna comminuted shaft fracture secondary to a dog bite with multiple forearm lacerations bilateral.  PROCEDURE:  Irrigation and debridement of open ulnar fracture. Debridement of skin, subcutaneous tissue, muscle and bone and open reduction and plate fixation, right ulna.  DePuy locking plate. Irrigation and debridement and dressing application, left forearm superficial skin avulsions.  SURGEON:  Areona Homer C. Ophelia Charter, MD  ANESTHESIA:  GOT.  ASSISTANT:  Maud Deed, PA-C, medically necessary and present for the entire procedure.  TOURNIQUET TIME:  None.  DESCRIPTION OF PROCEDURE:  After induction of general anesthesia with LMA placement, the patient received preoperative clindamycin, Ancef, and prophylactically.  Arm was prepped with DuraPrep.  At the same time, Maud Deed, [place], was working on the opposite forearm with skin avulsions with debridement after prepping, debridement of skin, reapproximation and dressing of the superficial skin abrasions.  The patient had show dogs which are [place] and 2 of the females got into a fight and she was trying to break the fight up when she was injured as described above with a bite Chakita Mcgraw to the forearm.  Abrasions are claw marks over her abdomen and skin avulsions over the left forearm.  There was a 5-cm distal dorsal entry site, multiple small punctures, and then a longitudinal tear directly over the ulna that looked  exactly like exposure in the mid shaft of the ulna with the surgical incision.  This extended down to the fracture.  After prepping and draping, stockinette, extremity sheets and drapes, pulsatile lavage was used for sharp debridement of skin, subcutaneous tissue, fascia muscle with a scalpel.  There were some dog ears present in both forearms prior to initial clamp before prepping and draping.  Pulse lavage was used for sharp debridement.  The incision directly over the subcu ulna was then washed out as well with pulse lavage.  Superficial veins were coagulated.  Exposure of the subcutaneous ulna was performed. A mini frag straight plate was selected and using combination of locking and nonlocking screws, plate was placed on the volar ulnar aspect.  The distal most screw was distal to the bone and therefore was bent over. Screw was angulated down into the ulnar styloid catching it.  This reduced the distal RU joint.  There was comminution of the fracture and a couple of small 1 x 1 or 1 x 2-3 mm pieces were discarded.  Repeat pulsatile evac was used.  There was a total of 5 bicortical screws proximally and there were 3 distal screws and the final port was angled and the distal 3 screws were all unicortical due to the distal RU joint. AP and lateral fluoroscopic spot  pictures were taken.  After repeat irrigation form was checked, it would pronate and supinate in normal fashion.  Repeated lavage and reapproximated the subcutaneous tissue with 2-0 Vicryl, 4-0 nylon sutures, loose closure of the volar entry site from the dog bite, and then the volar splinting of the wrist, hand dressing, tweeners, 4 x 4s, Webril, 1 application.  The patient will be placed in an arm elevator sling postoperatively.  The patient tolerated the procedure well and was transferred to the recovery room in stable condition.     Hiral Lukasiewicz C. Ophelia Charter, M.D.     MCY/MEDQ  D:  10/07/2011  T:  10/08/2011  Job:  [id number]

## 2011-10-09 MED ORDER — HYDROCHLOROTHIAZIDE 12.5 MG PO CAPS: 12.5000 mg | ORAL_CAPSULE | Freq: Every day | ORAL | Status: DC

## 2011-10-09 MED ORDER — OLMESARTAN MEDOXOMIL 20 MG PO TABS: 20.0000 mg | ORAL_TABLET | Freq: Every day | ORAL | Status: DC

## 2011-10-09 MED ORDER — RABEPRAZOLE SODIUM 20 MG PO TBEC: 20.0000 mg | DELAYED_RELEASE_TABLET | Freq: Every day | ORAL | Status: DC

## 2011-10-09 MED ORDER — OXYCODONE-ACETAMINOPHEN 5-325 MG PO TABS: 1.0000 | ORAL_TABLET | ORAL | Status: AC | PRN

## 2011-10-09 MED ORDER — PNEUMOCOCCAL VAC POLYVALENT 25 MCG/0.5ML IJ INJ: 0.5000 mL | INJECTION | INTRAMUSCULAR | Status: AC

## 2011-10-09 NOTE — Discharge Summary (Signed)
Physician Discharge Summary  Patient ID: Melinda Berger MRN: [id number] DOB/AGE: [birth year] 60 y.o.  Admit date: 10/07/2011 Discharge date: 10/09/2011  Admission Diagnoses:  Discharge Diagnoses:  Principal Problem:  *Dog bite of forearm Active Problems:  Fracture of ulna, right, open  Multiple abrasions   Discharged Condition: good  Hospital Course: underwent surgery, repair of bite laceration, ORIF ulna, post op IV pain meds, ABX times 48 hrs ,    Office 1  Wk Dr Ophelia Charter  Consults: none  Significant Diagnostic Studies: labs  Treatments: antibiotics: Ancef  Discharge Exam: Blood pressure 112/49, pulse 70, temperature 98.5 F (36.9 C), temperature source Oral, resp. rate 16, height 5' (1.524 m), weight 101.606 kg (224 lb), SpO2 95.00%. General appearance: alert  spint intack  Disposition: Final discharge disposition not confirmed   Medication List  As of 10/09/2011  9:48 AM   START taking these medications         hydrochlorothiazide 12.5 MG capsule   Commonly known as: MICROZIDE   Take 1 capsule (12.5 mg total) by mouth daily.      olmesartan 20 MG tablet   Commonly known as: BENICAR   Take 1 tablet (20 mg total) by mouth daily.      oxyCODONE-acetaminophen 5-325 MG per tablet   Commonly known as: PERCOCET   Take 1 tablet by mouth every 4 (four) hours as needed.      pneumococcal 23 valent vaccine 25 MCG/0.5ML injection   Commonly known as: PNU-IMMUNE   Inject 0.5 mLs into the muscle tomorrow at 10 am.      * RABEprazole 20 MG tablet   Commonly known as: ACIPHEX   Take 1 tablet (20 mg total) by mouth daily before breakfast.     * Notice: This list has 1 medication(s) that are the same as other medications prescribed for you. Read the directions carefully, and ask your doctor or other care provider to review them with you.       CONTINUE taking these medications         escitalopram 20 MG tablet   Commonly known as: LEXAPRO      LORazepam 0.5 MG tablet     Commonly known as: ATIVAN      olmesartan-hydrochlorothiazide 20-12.5 MG per tablet   Commonly known as: BENICAR HCT      * RABEprazole 20 MG tablet   Commonly known as: ACIPHEX     * Notice: This list has 1 medication(s) that are the same as other medications prescribed for you. Read the directions carefully, and ask your doctor or other care provider to review them with you.       STOP taking these medications         HYDROcodone-acetaminophen 5-325 MG per tablet      temazepam 30 MG capsule          Where to get your medications    These are the prescriptions that you need to pick up.   You may get these medications from any pharmacy.         oxyCODONE-acetaminophen 5-325 MG per tablet   pneumococcal 23 valent vaccine 25 MCG/0.5ML injection         Information on where to get these meds is not yet available. Ask your nurse or doctor.         hydrochlorothiazide 12.5 MG capsule   olmesartan 20 MG tablet   RABEprazole 20 MG tablet           Follow  up with Dr. Ophelia Charter in one week.   SignedEldred Manges 10/09/2011, 9:48 AM

## 2011-10-09 NOTE — Progress Notes (Signed)
Subjective:    Pain controlled. 48 hrs IV ABX will be done today.  Objective: Vital signs in last 24 hours: Temp:  [98.5 F (36.9 C)-99.6 F (37.6 C)] 98.5 F (36.9 C) (12/30 0607) Pulse Rate:  [70-83] 70  (12/30 0607) Resp:  [16-18] 16  (12/30 0607) BP: (100-146)/(42-73) 112/49 mmHg (12/30 0607) SpO2:  [95 %-98 %] 95 % (12/30 0607)  Intake/Output from previous day: 12/29 0701 - 12/30 0700 In: 1901.8 [P.O.:480; I.V.:1271.8; IV Piggyback:150] Out: -  Intake/Output this shift:     Basename 10/07/11 0956 10/07/11 0853  HGB 10.7* 4.5*    Basename 10/07/11 0956 10/07/11 0853  WBC 11.0* 13.8*  RBC 3.80* 1.59*  HCT 33.3* 13.9*  PLT 220 363    Basename 10/07/11 0853  NA 141  K 4.0  CL 109  CO2 22  BUN 19  CREATININE 1.20*  GLUCOSE 89  CALCIUM 8.7    Basename 10/07/11 0956  LABPT --  INR 1.03    Neurologically intact  Assessment/Plan: Dogbite with Ulna Fx S/P  ORIF , I and D open Fx.  Home today   Melinda Berger C 10/09/2011, 9:38 AM

## 2011-10-09 NOTE — Progress Notes (Signed)
Occupational Therapy Treatment Patient Details Name: Melinda Berger MRN: [id number] DOB: 12/13/1950 Today's Date: 10/09/2011 Time in: 8:40am Time out: 9:30 am 1TA, 2SC  OT Assessment/Plan OT Assessment/Plan Comments on Treatment Session: Pt making good progress with ADL tasks. Daughter present and hands on with care for pt.  OT Plan: Discharge plan remains appropriate OT Frequency: Min 2X/week Follow Up Recommendations: Outpatient OT;Other (comment) (agree at discretion of MD for R UE) Equipment Recommended: None recommended by OT OT Goals ADL Goals ADL Goal: Grooming - Progress: Met ADL Goal: Upper Body Bathing - Progress: Met ADL Goal: Lower Body Bathing - Progress: Met ADL Goal: Upper Body Dressing - Progress: Progressing toward goals ADL Goal: Lower Body Dressing - Progress: Progressing toward goals ADL Goal: Toilet Transfer - Progress: Progressing toward goals ADL Goal: Additional Goal #1 - Progress: Progressing toward goals  OT Treatment Precautions/Restrictions      ADL ADL Grooming: Teeth care;Performed;Other (comment) (min verbal cues to keep R UE splint dry) Where Assessed - Grooming: Standing at sink Upper Body Bathing: Performed;Minimal assistance Upper Body Bathing Details (indicate cue type and reason): pt with decreased use L UE (states she needs a shoulder surgery) so she is unable to wash under left arm using L UE (reaching up under to armpit) so min assist to help wash this area. Where Assessed - Upper Body Bathing: Standing at sink Lower Body Bathing: Performed;Minimal assistance Where Assessed - Lower Body Bathing: Sitting at sink;Standing at sink Upper Body Dressing: Moderate assistance Upper Body Dressing Details (indicate cue type and reason): with gown. assisted fully with don sling and educated daughter on how to don sling. Sling size large and is too big for patient. Informed nursing who will obtain medium size. Will need to have daughter practice  don/doff sling once correct size obtained. Where Assessed - Upper Body Dressing: Standing Lower Body Dressing: Performed;Set up Lower Body Dressing Details (indicate cue type and reason): able to cross legs to don both socks. Where Assessed - Lower Body Dressing: Unsupported;Sitting, chair Toilet Transfer: Simulated;Minimal assistance Toilet Transfer Details (indicate cue type and reason): min guard assist for safety with multiple lines and cluttered room Toilet Transfer Method: Ambulating Toileting - Hygiene: Simulated;Set up Toileting - Hygiene Details (indicate cue type and reason): simulated during bath to wash posterior periarea with washcloth. Difficult to reach this area so educated pt and daughter on toilet aid option as well as LHS to thoroughly clean posterior periarea. Able to reach front periarea fully. Where Assessed - Toileting Hygiene: Standing ADL Comments: Encouraged pt to move digits on both hands frequently. Mobility  Transfers Sit to Stand: 5: Supervision;From chair/3-in-1 Stand to Sit: 5: Supervision;To chair/3-in-1 Exercises    End of Session OT - End of Session Activity Tolerance: Patient tolerated treatment well Patient left: in chair;with call bell in reach;with family/visitor present General Behavior During Session: [facility] for tasks performed Cognition: [facility] for tasks performed  Lennox Laity  [phone] 10/09/2011, 9:53 AM

## 2011-10-12 ENCOUNTER — Encounter (HOSPITAL_COMMUNITY): Payer: Self-pay | Admitting: Orthopaedic Surgery

## 2015-06-11 DIAGNOSIS — J189 Pneumonia, unspecified organism: Secondary | ICD-10-CM

## 2015-06-11 HISTORY — DX: Pneumonia, unspecified organism: J18.9

## 2015-07-12 ENCOUNTER — Inpatient Hospital Stay (HOSPITAL_BASED_OUTPATIENT_CLINIC_OR_DEPARTMENT_OTHER)
Admission: EM | Admit: 2015-07-12 | Discharge: 2015-07-20 | DRG: 871 | Disposition: A | Discharge status: Home / Self Care | Payer: Managed Care, Other (non HMO) | Attending: Internal Medicine | Admitting: Internal Medicine

## 2015-07-12 ENCOUNTER — Emergency Department (HOSPITAL_BASED_OUTPATIENT_CLINIC_OR_DEPARTMENT_OTHER): Payer: Managed Care, Other (non HMO)

## 2015-07-12 ENCOUNTER — Encounter (HOSPITAL_BASED_OUTPATIENT_CLINIC_OR_DEPARTMENT_OTHER): Payer: Self-pay | Admitting: Emergency Medicine

## 2015-07-12 DIAGNOSIS — K219 Gastro-esophageal reflux disease without esophagitis: Secondary | ICD-10-CM | POA: Diagnosis present

## 2015-07-12 DIAGNOSIS — J13 Pneumonia due to Streptococcus pneumoniae: Secondary | ICD-10-CM | POA: Diagnosis present

## 2015-07-12 DIAGNOSIS — E878 Other disorders of electrolyte and fluid balance, not elsewhere classified: Secondary | ICD-10-CM | POA: Diagnosis present

## 2015-07-12 DIAGNOSIS — Z86718 Personal history of other venous thrombosis and embolism: Secondary | ICD-10-CM

## 2015-07-12 DIAGNOSIS — Z888 Allergy status to other drugs, medicaments and biological substances status: Secondary | ICD-10-CM

## 2015-07-12 DIAGNOSIS — E872 Acidosis: Secondary | ICD-10-CM | POA: Diagnosis present

## 2015-07-12 DIAGNOSIS — I129 Hypertensive chronic kidney disease with stage 1 through stage 4 chronic kidney disease, or unspecified chronic kidney disease: Secondary | ICD-10-CM | POA: Diagnosis present

## 2015-07-12 DIAGNOSIS — J90 Pleural effusion, not elsewhere classified: Secondary | ICD-10-CM | POA: Insufficient documentation

## 2015-07-12 DIAGNOSIS — I272 Other secondary pulmonary hypertension: Secondary | ICD-10-CM | POA: Diagnosis present

## 2015-07-12 DIAGNOSIS — Z886 Allergy status to analgesic agent status: Secondary | ICD-10-CM

## 2015-07-12 DIAGNOSIS — K2270 Barrett's esophagus without dysplasia: Secondary | ICD-10-CM | POA: Diagnosis present

## 2015-07-12 DIAGNOSIS — E876 Hypokalemia: Secondary | ICD-10-CM | POA: Diagnosis present

## 2015-07-12 DIAGNOSIS — Z882 Allergy status to sulfonamides status: Secondary | ICD-10-CM

## 2015-07-12 DIAGNOSIS — E785 Hyperlipidemia, unspecified: Secondary | ICD-10-CM | POA: Diagnosis present

## 2015-07-12 DIAGNOSIS — N390 Urinary tract infection, site not specified: Secondary | ICD-10-CM | POA: Diagnosis present

## 2015-07-12 DIAGNOSIS — Z79899 Other long term (current) drug therapy: Secondary | ICD-10-CM

## 2015-07-12 DIAGNOSIS — N183 Chronic kidney disease, stage 3 (moderate): Secondary | ICD-10-CM | POA: Diagnosis present

## 2015-07-12 DIAGNOSIS — E8809 Other disorders of plasma-protein metabolism, not elsewhere classified: Secondary | ICD-10-CM | POA: Diagnosis present

## 2015-07-12 DIAGNOSIS — D649 Anemia, unspecified: Secondary | ICD-10-CM | POA: Diagnosis present

## 2015-07-12 DIAGNOSIS — J189 Pneumonia, unspecified organism: Secondary | ICD-10-CM | POA: Diagnosis not present

## 2015-07-12 DIAGNOSIS — G43909 Migraine, unspecified, not intractable, without status migrainosus: Secondary | ICD-10-CM | POA: Diagnosis present

## 2015-07-12 DIAGNOSIS — G4700 Insomnia, unspecified: Secondary | ICD-10-CM | POA: Diagnosis present

## 2015-07-12 DIAGNOSIS — A419 Sepsis, unspecified organism: Principal | ICD-10-CM | POA: Diagnosis present

## 2015-07-12 DIAGNOSIS — E559 Vitamin D deficiency, unspecified: Secondary | ICD-10-CM | POA: Diagnosis present

## 2015-07-12 DIAGNOSIS — R0602 Shortness of breath: Secondary | ICD-10-CM

## 2015-07-12 DIAGNOSIS — J45909 Unspecified asthma, uncomplicated: Secondary | ICD-10-CM | POA: Diagnosis present

## 2015-07-12 HISTORY — DX: Personal history of other diseases of the digestive system: Z87.19

## 2015-07-12 HISTORY — DX: Depression, unspecified: F32.A

## 2015-07-12 HISTORY — DX: Unspecified osteoarthritis, unspecified site: M19.90

## 2015-07-12 HISTORY — DX: Cardiac murmur, unspecified: R01.1

## 2015-07-12 HISTORY — DX: Migraine, unspecified, not intractable, without status migrainosus: G43.909

## 2015-07-12 HISTORY — DX: Major depressive disorder, single episode, unspecified: F32.9

## 2015-07-12 HISTORY — DX: Anxiety disorder, unspecified: F41.9

## 2015-07-12 HISTORY — DX: Chronic kidney disease, unspecified: N18.9

## 2015-07-12 HISTORY — DX: Gastro-esophageal reflux disease without esophagitis: K21.9

## 2015-07-12 LAB — CBC WITH DIFFERENTIAL/PLATELET
BASOS ABS: 0 10*3/uL (ref 0.0–0.1)
BASOS PCT: 0 %
Eosinophils Absolute: 0.3 10*3/uL (ref 0.0–0.7)
Eosinophils Relative: 3 %
HEMATOCRIT: 34.5 % — AB (ref 36.0–46.0)
HEMOGLOBIN: 11 g/dL — AB (ref 12.0–15.0)
LYMPHS PCT: 21 %
Lymphs Abs: 2.6 10*3/uL (ref 0.7–4.0)
MCH: 29.9 pg (ref 26.0–34.0)
MCHC: 31.9 g/dL (ref 30.0–36.0)
MCV: 93.8 fL (ref 78.0–100.0)
MONO ABS: 1.3 10*3/uL — AB (ref 0.1–1.0)
Monocytes Relative: 10 %
NEUTROS ABS: 8.3 10*3/uL — AB (ref 1.7–7.7)
NEUTROS PCT: 66 %
Platelets: 181 10*3/uL (ref 150–400)
RBC: 3.68 MIL/uL — AB (ref 3.87–5.11)
RDW: 12.8 % (ref 11.5–15.5)
WBC: 12.5 10*3/uL — AB (ref 4.0–10.5)

## 2015-07-12 LAB — URINALYSIS, ROUTINE W REFLEX MICROSCOPIC
BILIRUBIN URINE: NEGATIVE
GLUCOSE, UA: NEGATIVE mg/dL
Hgb urine dipstick: NEGATIVE
Ketones, ur: NEGATIVE mg/dL
NITRITE: NEGATIVE
PH: 5.5 (ref 5.0–8.0)
Protein, ur: NEGATIVE mg/dL
SPECIFIC GRAVITY, URINE: 1.016 (ref 1.005–1.030)
Urobilinogen, UA: 0.2 mg/dL (ref 0.0–1.0)

## 2015-07-12 LAB — COMPREHENSIVE METABOLIC PANEL
ALBUMIN: 3.2 g/dL — AB (ref 3.5–5.0)
ALT: 10 U/L — AB (ref 14–54)
AST: 20 U/L (ref 15–41)
Alkaline Phosphatase: 92 U/L (ref 38–126)
Anion gap: 6 (ref 5–15)
BILIRUBIN TOTAL: 0.4 mg/dL (ref 0.3–1.2)
BUN: 28 mg/dL — AB (ref 6–20)
CO2: 20 mmol/L — ABNORMAL LOW (ref 22–32)
CREATININE: 1.71 mg/dL — AB (ref 0.44–1.00)
Calcium: 7.6 mg/dL — ABNORMAL LOW (ref 8.9–10.3)
Chloride: 112 mmol/L — ABNORMAL HIGH (ref 101–111)
GFR calc Af Amer: 35 mL/min — ABNORMAL LOW (ref >60)
GFR calc non Af Amer: 30 mL/min — ABNORMAL LOW (ref >60)
GLUCOSE: 115 mg/dL — AB (ref 65–99)
POTASSIUM: 3.8 mmol/L (ref 3.5–5.1)
Sodium: 138 mmol/L (ref 135–145)
TOTAL PROTEIN: 6.2 g/dL — AB (ref 6.5–8.1)

## 2015-07-12 LAB — URINE MICROSCOPIC-ADD ON

## 2015-07-12 LAB — LIPASE, BLOOD: Lipase: 28 U/L (ref 22–51)

## 2015-07-12 MED ORDER — DEXTROSE 5 % IV SOLN: 1.0000 g | Freq: Once | INTRAVENOUS | Status: DC

## 2015-07-12 MED ORDER — ALBUTEROL SULFATE HFA 108 (90 BASE) MCG/ACT IN AERS: 2.0000 | INHALATION_SPRAY | Freq: Once | RESPIRATORY_TRACT | Status: DC

## 2015-07-12 MED ORDER — ONDANSETRON HCL 4 MG/2ML IJ SOLN
4.0000 mg | Freq: Once | INTRAMUSCULAR | Status: AC
  Administered 2015-07-12: 4 mg via INTRAVENOUS
  Filled 2015-07-12: qty 2

## 2015-07-12 MED ORDER — SODIUM CHLORIDE 0.9 % IV BOLUS (SEPSIS)
1000.0000 mL | Freq: Once | INTRAVENOUS | Status: AC
  Administered 2015-07-13: 1000 mL via INTRAVENOUS

## 2015-07-12 MED ORDER — DEXTROSE 5 % IV SOLN: 500.0000 mg | Freq: Once | INTRAVENOUS | Status: DC

## 2015-07-12 MED ORDER — IPRATROPIUM-ALBUTEROL 0.5-2.5 (3) MG/3ML IN SOLN
3.0000 mL | Freq: Once | RESPIRATORY_TRACT | Status: AC
  Administered 2015-07-12: 3 mL via RESPIRATORY_TRACT
  Filled 2015-07-12: qty 3

## 2015-07-12 MED ORDER — SODIUM CHLORIDE 0.9 % IV SOLN
1000.0000 mL | Freq: Once | INTRAVENOUS | Status: AC
  Administered 2015-07-12: 1000 mL via INTRAVENOUS

## 2015-07-12 MED ORDER — SODIUM CHLORIDE 0.9 % IV SOLN
1000.0000 mL | INTRAVENOUS | Status: DC
  Administered 2015-07-12: 1000 mL via INTRAVENOUS

## 2015-07-12 MED ORDER — LEVOFLOXACIN IN D5W 750 MG/150ML IV SOLN
750.0000 mg | Freq: Once | INTRAVENOUS | Status: AC
  Administered 2015-07-13: 750 mg via INTRAVENOUS
  Filled 2015-07-12: qty 150

## 2015-07-12 NOTE — ED Notes (Signed)
Pt alert, NAD, calm, interactive, resps e/u, no dyspnea noted, denies pain, nausea improved IVF bolus infusing. Pt to xray.

## 2015-07-12 NOTE — ED Provider Notes (Signed)
CSN: [id number]     Arrival date & time 07/12/15  1900 History   First MD Initiated Contact with Patient 07/12/15 2123     Chief Complaint  Patient presents with  . Fever     (Consider location/radiation/quality/duration/timing/severity/associated sxs/prior Treatment) HPI Comments: Pt is a 64 yo female who presents to the ED with complaint of fever, onset 3 days. Pt reports having fever, cough, chills, myalgias, sore throat, SOB, nausea, weakness and abdominal pain. She reports having mild diffuse intermittent abdominal pain. Denies any aggravating or alleviating factors. She reports to feeling weak over the past few days. Denies taking any tx at home. Pt states she has been able to tolerate PO at home. Pt reports she is being tx for UTI (8/22) and has a few days left of her keflex. She endorses getting a flu shot last month.   Past Medical History  Diagnosis Date  . Hypertension   . Asthma   . Barrett esophagus   . Thyroid disease   . Migraines    Past Surgical History  Procedure Laterality Date  . Tonsillectomy    . Orif ulnar fracture  10/07/2011    Procedure: OPEN REDUCTION INTERNAL FIXATION (ORIF) ULNAR FRACTURE;  Surgeon: Eldred Manges;  Location: [facility] OR;  Service: Orthopedics;  Laterality: Right;  . I&d extremity  10/07/2011    Procedure: IRRIGATION AND DEBRIDEMENT EXTREMITY;  Surgeon: Eldred Manges;  Location: [facility] OR;  Service: Orthopedics;  Laterality: Right;  . Rotator cuff repair    . Replacement total knee Left    History reviewed. No pertinent family history. Social History  Substance Use Topics  . Smoking status: Never Smoker   . Smokeless tobacco: Never Used  . Alcohol Use: Yes   OB History    No data available     Review of Systems  Constitutional: Positive for chills.  HENT: Positive for sore throat.   Respiratory: Positive for cough and shortness of breath.   Gastrointestinal: Positive for nausea and abdominal pain.  Musculoskeletal: Positive for myalgias.   Neurological: Positive for weakness.  All other systems reviewed and are negative.     Allergies  Nsaids and Zolpidem tartrate  Home Medications   Prior to Admission medications   Medication Sig Start Date End Date Taking? Authorizing Provider  Fluticasone-Salmeterol (ADVAIR) 250-50 MCG/DOSE AEPB Inhale 1 puff into the lungs 2 (two) times daily.   Yes Historical Provider, MD  montelukast (SINGULAIR) 10 MG tablet Take 10 mg by mouth at bedtime.   Yes Historical Provider, MD  temazepam (RESTORIL) 30 MG capsule Take 30 mg by mouth at bedtime as needed for sleep.   Yes Historical Provider, MD  escitalopram (LEXAPRO) 20 MG tablet Take 20 mg by mouth at bedtime.      Historical Provider, MD  hydrochlorothiazide (MICROZIDE) 12.5 MG capsule Take 1 capsule (12.5 mg total) by mouth daily. 10/09/11 10/08/12  Eldred Manges, MD  LORazepam (ATIVAN) 0.5 MG tablet Take 0.5 mg by mouth every 6 (six) hours as needed. For anxiety     Historical Provider, MD  olmesartan (BENICAR) 20 MG tablet Take 1 tablet (20 mg total) by mouth daily. 10/09/11 10/08/12  Eldred Manges, MD  olmesartan-hydrochlorothiazide (BENICAR HCT) 20-12.5 MG per tablet Take 1 tablet by mouth daily.      Historical Provider, MD  RABEprazole (ACIPHEX) 20 MG tablet Take 20 mg by mouth daily.     Historical Provider, MD  RABEprazole (ACIPHEX) 20 MG tablet Take 1 tablet (  20 mg total) by mouth daily before breakfast. 10/09/11 10/08/12  Eldred Manges, MD   BP 98/42 mmHg  Pulse 66  Temp(Src) 97.8 F (36.6 C) (Oral)  Resp 16  Ht 5' (1.524 m)  Wt 198 lb (89.812 kg)  BMI 38.67 kg/m2  SpO2 100% Physical Exam  Constitutional: She is oriented to person, place, and time. She appears well-developed and well-nourished.  HENT:  Head: Normocephalic and atraumatic.  Right Ear: Tympanic membrane normal.  Left Ear: Tympanic membrane normal.  Nose: Nose normal. Right sinus exhibits no maxillary sinus tenderness and no frontal sinus tenderness. Left  sinus exhibits no maxillary sinus tenderness and no frontal sinus tenderness.  Mouth/Throat: Uvula is midline and oropharynx is clear and moist. Mucous membranes are dry. No oropharyngeal exudate, posterior oropharyngeal edema or posterior oropharyngeal erythema.  Eyes: Conjunctivae and EOM are normal. Pupils are equal, round, and reactive to light. Right eye exhibits no discharge. Left eye exhibits no discharge. No scleral icterus.  Neck: Normal range of motion. Neck supple.  Cardiovascular: Normal rate, regular rhythm, normal heart sounds and intact distal pulses.   No murmur heard. Pulmonary/Chest: Effort normal and breath sounds normal. No respiratory distress. She has no wheezes. She exhibits no tenderness.  Crackles noted in left lower lobe  Abdominal: Soft. Bowel sounds are normal. She exhibits no distension and no mass. There is no tenderness. There is no rebound and no guarding.  Musculoskeletal: Normal range of motion. She exhibits no edema or tenderness.  Lymphadenopathy:    She has no cervical adenopathy.  Neurological: She is alert and oriented to person, place, and time.  Skin: Skin is warm and dry. No rash noted.  Nursing note and vitals reviewed.   ED Course  Procedures (including critical care time) Labs Review Labs Reviewed - No data to display  Imaging Review No results found. I have personally reviewed and evaluated these images and lab results as part of my medical decision-making.   EKG Interpretation None      Filed Vitals:   07/12/15 2230  BP: 91/49  Pulse: 65  Temp:   Resp: 20     MDM   Final diagnoses:  CAP (community acquired pneumonia)    Pt presents with fever, chills, SOB, cough, sore throat, abdominal pain and nausea. Denies any sick contacts. Pt taking Keflex for UTI. Pt afebrile on arrival to ED, hypotensive (98/45), oxygen 100% on room air. Crackles noted in LLL, no respiratory distress. Abdominal exam benign. Dry mucous membranes. IVF and  zofran given. WBC 12.5 with left shift. CXR revealed LLL infiltrate. UA shows few bacteria. BP still in 90s systolic after 1L and maintenance IVF. Consulted hospitalist, who agree to admit. Pt started on IV levo in ED. Blood cultures drawn. Lactic acid ordered. Urine culture added. Discussed plan for admission with pt.    Satira Sark [place], [place] 07/13/15 0012  Courteney Randall An, MD 07/16/15 1113

## 2015-07-12 NOTE — ED Notes (Signed)
Pt states she has had Fever, cough, chills, back and chest pain for 3 days

## 2015-07-13 ENCOUNTER — Encounter (HOSPITAL_COMMUNITY): Payer: Self-pay | Admitting: *Deleted

## 2015-07-13 DIAGNOSIS — K2270 Barrett's esophagus without dysplasia: Secondary | ICD-10-CM | POA: Diagnosis present

## 2015-07-13 DIAGNOSIS — G4700 Insomnia, unspecified: Secondary | ICD-10-CM

## 2015-07-13 DIAGNOSIS — Z79899 Other long term (current) drug therapy: Secondary | ICD-10-CM | POA: Diagnosis not present

## 2015-07-13 DIAGNOSIS — A419 Sepsis, unspecified organism: Principal | ICD-10-CM

## 2015-07-13 DIAGNOSIS — D649 Anemia, unspecified: Secondary | ICD-10-CM | POA: Diagnosis present

## 2015-07-13 DIAGNOSIS — K210 Gastro-esophageal reflux disease with esophagitis: Secondary | ICD-10-CM | POA: Diagnosis not present

## 2015-07-13 DIAGNOSIS — E876 Hypokalemia: Secondary | ICD-10-CM | POA: Diagnosis present

## 2015-07-13 DIAGNOSIS — K219 Gastro-esophageal reflux disease without esophagitis: Secondary | ICD-10-CM | POA: Diagnosis present

## 2015-07-13 DIAGNOSIS — G43909 Migraine, unspecified, not intractable, without status migrainosus: Secondary | ICD-10-CM | POA: Diagnosis present

## 2015-07-13 DIAGNOSIS — Z886 Allergy status to analgesic agent status: Secondary | ICD-10-CM | POA: Diagnosis not present

## 2015-07-13 DIAGNOSIS — E8809 Other disorders of plasma-protein metabolism, not elsewhere classified: Secondary | ICD-10-CM | POA: Diagnosis present

## 2015-07-13 DIAGNOSIS — J189 Pneumonia, unspecified organism: Secondary | ICD-10-CM | POA: Diagnosis present

## 2015-07-13 DIAGNOSIS — Z888 Allergy status to other drugs, medicaments and biological substances status: Secondary | ICD-10-CM | POA: Diagnosis not present

## 2015-07-13 DIAGNOSIS — Z882 Allergy status to sulfonamides status: Secondary | ICD-10-CM | POA: Diagnosis not present

## 2015-07-13 DIAGNOSIS — E872 Acidosis: Secondary | ICD-10-CM | POA: Diagnosis present

## 2015-07-13 DIAGNOSIS — N390 Urinary tract infection, site not specified: Secondary | ICD-10-CM | POA: Diagnosis present

## 2015-07-13 DIAGNOSIS — R0600 Dyspnea, unspecified: Secondary | ICD-10-CM | POA: Diagnosis not present

## 2015-07-13 DIAGNOSIS — E785 Hyperlipidemia, unspecified: Secondary | ICD-10-CM | POA: Diagnosis present

## 2015-07-13 DIAGNOSIS — Z86718 Personal history of other venous thrombosis and embolism: Secondary | ICD-10-CM | POA: Diagnosis not present

## 2015-07-13 DIAGNOSIS — J90 Pleural effusion, not elsewhere classified: Secondary | ICD-10-CM | POA: Diagnosis present

## 2015-07-13 DIAGNOSIS — J13 Pneumonia due to Streptococcus pneumoniae: Secondary | ICD-10-CM | POA: Diagnosis present

## 2015-07-13 DIAGNOSIS — K449 Diaphragmatic hernia without obstruction or gangrene: Secondary | ICD-10-CM | POA: Diagnosis not present

## 2015-07-13 DIAGNOSIS — I129 Hypertensive chronic kidney disease with stage 1 through stage 4 chronic kidney disease, or unspecified chronic kidney disease: Secondary | ICD-10-CM | POA: Diagnosis present

## 2015-07-13 DIAGNOSIS — E559 Vitamin D deficiency, unspecified: Secondary | ICD-10-CM | POA: Diagnosis present

## 2015-07-13 DIAGNOSIS — J45909 Unspecified asthma, uncomplicated: Secondary | ICD-10-CM | POA: Diagnosis present

## 2015-07-13 DIAGNOSIS — N179 Acute kidney failure, unspecified: Secondary | ICD-10-CM | POA: Diagnosis not present

## 2015-07-13 DIAGNOSIS — E878 Other disorders of electrolyte and fluid balance, not elsewhere classified: Secondary | ICD-10-CM | POA: Diagnosis present

## 2015-07-13 DIAGNOSIS — N183 Chronic kidney disease, stage 3 (moderate): Secondary | ICD-10-CM | POA: Diagnosis present

## 2015-07-13 DIAGNOSIS — G43809 Other migraine, not intractable, without status migrainosus: Secondary | ICD-10-CM

## 2015-07-13 DIAGNOSIS — I272 Other secondary pulmonary hypertension: Secondary | ICD-10-CM | POA: Diagnosis present

## 2015-07-13 DIAGNOSIS — A403 Sepsis due to Streptococcus pneumoniae: Secondary | ICD-10-CM | POA: Diagnosis not present

## 2015-07-13 LAB — CBC WITH DIFFERENTIAL/PLATELET
BASOS ABS: 0 10*3/uL (ref 0.0–0.1)
BASOS PCT: 0 %
Eosinophils Absolute: 0.6 10*3/uL (ref 0.0–0.7)
Eosinophils Relative: 6 %
HEMATOCRIT: 31 % — AB (ref 36.0–46.0)
HEMOGLOBIN: 9.7 g/dL — AB (ref 12.0–15.0)
Lymphocytes Relative: 27 %
Lymphs Abs: 2.3 10*3/uL (ref 0.7–4.0)
MCH: 29.1 pg (ref 26.0–34.0)
MCHC: 31.3 g/dL (ref 30.0–36.0)
MCV: 93.1 fL (ref 78.0–100.0)
MONOS PCT: 10 %
Monocytes Absolute: 0.8 10*3/uL (ref 0.1–1.0)
NEUTROS ABS: 5 10*3/uL (ref 1.7–7.7)
NEUTROS PCT: 57 %
Platelets: 167 10*3/uL (ref 150–400)
RBC: 3.33 MIL/uL — ABNORMAL LOW (ref 3.87–5.11)
RDW: 13.2 % (ref 11.5–15.5)
WBC: 8.7 10*3/uL (ref 4.0–10.5)

## 2015-07-13 LAB — COMPREHENSIVE METABOLIC PANEL
ALK PHOS: 81 U/L (ref 38–126)
ALT: 10 U/L — ABNORMAL LOW (ref 14–54)
ANION GAP: 5 (ref 5–15)
AST: 19 U/L (ref 15–41)
Albumin: 2.5 g/dL — ABNORMAL LOW (ref 3.5–5.0)
BUN: 22 mg/dL — ABNORMAL HIGH (ref 6–20)
CALCIUM: 6.8 mg/dL — AB (ref 8.9–10.3)
CO2: 18 mmol/L — AB (ref 22–32)
Chloride: 116 mmol/L — ABNORMAL HIGH (ref 101–111)
Creatinine, Ser: 1.44 mg/dL — ABNORMAL HIGH (ref 0.44–1.00)
GFR calc non Af Amer: 37 mL/min — ABNORMAL LOW (ref >60)
GFR, EST AFRICAN AMERICAN: 43 mL/min — AB (ref >60)
Glucose, Bld: 117 mg/dL — ABNORMAL HIGH (ref 65–99)
POTASSIUM: 3.4 mmol/L — AB (ref 3.5–5.1)
SODIUM: 139 mmol/L (ref 135–145)
TOTAL PROTEIN: 5 g/dL — AB (ref 6.5–8.1)
Total Bilirubin: 0.3 mg/dL (ref 0.3–1.2)

## 2015-07-13 LAB — I-STAT CG4 LACTIC ACID, ED: LACTIC ACID, VENOUS: 0.59 mmol/L (ref 0.5–2.0)

## 2015-07-13 LAB — GLUCOSE, CAPILLARY: Glucose-Capillary: 92 mg/dL (ref 65–99)

## 2015-07-13 LAB — STREP PNEUMONIAE URINARY ANTIGEN: Strep Pneumo Urinary Antigen: POSITIVE — AB

## 2015-07-13 LAB — MRSA PCR SCREENING: MRSA by PCR: POSITIVE — AB

## 2015-07-13 MED ORDER — PANTOPRAZOLE SODIUM 40 MG PO TBEC
40.0000 mg | DELAYED_RELEASE_TABLET | Freq: Every day | ORAL | Status: DC
  Administered 2015-07-13 – 2015-07-20 (×8): 40 mg via ORAL
  Filled 2015-07-13 (×8): qty 1

## 2015-07-13 MED ORDER — MOMETASONE FURO-FORMOTEROL FUM 100-5 MCG/ACT IN AERO
2.0000 | INHALATION_SPRAY | Freq: Two times a day (BID) | RESPIRATORY_TRACT | Status: DC
  Administered 2015-07-13 – 2015-07-19 (×13): 2 via RESPIRATORY_TRACT
  Filled 2015-07-13 (×3): qty 8.8

## 2015-07-13 MED ORDER — TEMAZEPAM 15 MG PO CAPS
30.0000 mg | ORAL_CAPSULE | Freq: Every evening | ORAL | Status: DC | PRN
  Administered 2015-07-13 – 2015-07-19 (×7): 30 mg via ORAL
  Filled 2015-07-13 (×7): qty 2

## 2015-07-13 MED ORDER — CHLORHEXIDINE GLUCONATE CLOTH 2 % EX PADS
6.0000 | MEDICATED_PAD | Freq: Every day | CUTANEOUS | Status: AC
  Administered 2015-07-13 – 2015-07-17 (×5): 6 via TOPICAL

## 2015-07-13 MED ORDER — ESCITALOPRAM OXALATE 20 MG PO TABS
20.0000 mg | ORAL_TABLET | Freq: Every day | ORAL | Status: DC
  Administered 2015-07-13 – 2015-07-19 (×7): 20 mg via ORAL
  Filled 2015-07-13 (×5): qty 1
  Filled 2015-07-13: qty 2
  Filled 2015-07-13: qty 1

## 2015-07-13 MED ORDER — OXYCODONE HCL 5 MG PO TABS
5.0000 mg | ORAL_TABLET | ORAL | Status: DC | PRN
  Administered 2015-07-13 – 2015-07-17 (×3): 5 mg via ORAL
  Filled 2015-07-13 (×3): qty 1

## 2015-07-13 MED ORDER — MUPIROCIN 2 % EX OINT
1.0000 "application " | TOPICAL_OINTMENT | Freq: Two times a day (BID) | CUTANEOUS | Status: AC
  Administered 2015-07-13 – 2015-07-17 (×10): 1 via NASAL
  Filled 2015-07-13 (×3): qty 22

## 2015-07-13 MED ORDER — MONTELUKAST SODIUM 10 MG PO TABS
10.0000 mg | ORAL_TABLET | Freq: Every day | ORAL | Status: DC
  Administered 2015-07-13 – 2015-07-19 (×7): 10 mg via ORAL
  Filled 2015-07-13 (×7): qty 1

## 2015-07-13 MED ORDER — BUTALBITAL-APAP-CAFFEINE 50-325-40 MG PO TABS: 1.0000 | ORAL_TABLET | Freq: Two times a day (BID) | ORAL | Status: DC | PRN

## 2015-07-13 MED ORDER — HEPARIN SODIUM (PORCINE) 5000 UNIT/ML IJ SOLN
5000.0000 [IU] | Freq: Three times a day (TID) | INTRAMUSCULAR | Status: DC
  Administered 2015-07-13 – 2015-07-20 (×21): 5000 [IU] via SUBCUTANEOUS
  Filled 2015-07-13 (×18): qty 1

## 2015-07-13 MED ORDER — TOPIRAMATE 100 MG PO TABS
100.0000 mg | ORAL_TABLET | Freq: Every day | ORAL | Status: DC
  Administered 2015-07-13 – 2015-07-20 (×8): 100 mg via ORAL
  Filled 2015-07-13 (×8): qty 1

## 2015-07-13 MED ORDER — SODIUM CHLORIDE 0.9 % IV SOLN
INTRAVENOUS | Status: DC
  Administered 2015-07-13 (×2): via INTRAVENOUS
  Administered 2015-07-13: 1000 mL via INTRAVENOUS
  Administered 2015-07-14 – 2015-07-15 (×3): via INTRAVENOUS

## 2015-07-13 MED ORDER — LEVOFLOXACIN IN D5W 750 MG/150ML IV SOLN
750.0000 mg | INTRAVENOUS | Status: DC
  Administered 2015-07-14: 750 mg via INTRAVENOUS
  Filled 2015-07-13 (×2): qty 150

## 2015-07-13 NOTE — Progress Notes (Signed)
Utilization review completed. Sabirin Baray, RN, BSN. 

## 2015-07-13 NOTE — Progress Notes (Signed)
Patient seen and evaluated earlier this a.m. but my associate. Please refer to H&P for details regarding assessment and plan.  Patient seen and evaluated Physical exam General patient in no acute distress Pulmonary: Equal chest rise, no wheezes Cardiovascular: S1 and S2 within normal limits, no rubs  We'll reassess next a.m. for now continue Levaquin.  Jaremy Nosal, [employer]

## 2015-07-13 NOTE — Progress Notes (Signed)
Nutrition Brief Note  Patient identified on the Malnutrition Screening Tool (MST) Report.  Per weight readings below, no recent weight loss.  Last weight is from 2012.  Wt Readings from Last 15 Encounters:  07/13/15 206 lb 2.1 oz (93.5 kg)  10/07/11 224 lb (101.606 kg)  02/18/10 237 lb 4 oz (107.616 kg)  08/17/09 239 lb 6.1 oz (108.583 kg)  02/16/09 238 lb (107.956 kg)  11/03/08 240 lb 9.6 oz (109.135 kg)  08/05/08 240 lb 12.8 oz (109.226 kg)  06/02/08 235 lb 12.8 oz (106.958 kg)  04/28/08 233 lb (105.688 kg)  03/17/08 222 lb 12.8 oz (101.061 kg)    Body mass index is 40.26 kg/(m^2). Patient meets criteria for Obesity Class III based on current BMI.   Current diet order is Heart Healthy, patient is consuming approximately 50% of meals at this time. Labs and medications reviewed.   No nutrition interventions warranted at this time. If nutrition issues arise, please consult RD.   Maureen Chatters, RD, LDN Pager #: [phone] After-Hours Pager #: [phone]

## 2015-07-13 NOTE — H&P (Signed)
[facility] History and Physical  Patient: Melinda Berger  MRN: [id number]  DOB: July 11, 1951  DOS: the patient was seen and examined on 07/13/2015 PCP: No primary care provider on file.  Referring physician: Barrett Henle, PA-C Chief Complaint: Cough and back pain  HPI: Chamia Schmutz is a 64 y.o. female with Past medical history of hypertension, asthma, Barrett's esophagus, anxiety, GERD, chronic kidney disease. Patient presents with complaints of generalized fatigue and malaise as well as back pain. Next and she also has cough with expectoration. Next and she developed increased shortness of breath. With this she presented to the ER. She also had some fever and chills. This Is All Ongoing since Last 3 Days and Progressively Worsening. Patient Was Found to Be Hypotensive in the ER and Was Also Found to Be Having a Pneumonia and Therefore Was Brought Here for Further Workup and Treatment. The Time of My Evaluation Patient Mentions Her Pain Has Resolved and She Does Not Have Any Fever or Chills or Breathing Is Better As Well. She Feels Much Better Than Her Initial Arrival to ER.  The patient is coming from home.  At her baseline ambulates without support And is independent for most of her ADL; manages her medication on her own.  Review of Systems: as mentioned in the history of present illness.  A comprehensive review of the other systems is negative.  Past Medical History  Diagnosis Date  . Hypertension   . Asthma   . Barrett esophagus   . Thyroid disease   . Migraines   . Heart murmur   . Depression   . Anxiety   . Chronic kidney disease   . GERD (gastroesophageal reflux disease)   . History of hiatal hernia   . Arthritis    Past Surgical History  Procedure Laterality Date  . Tonsillectomy    . Orif ulnar fracture  10/07/2011    Procedure: OPEN REDUCTION INTERNAL FIXATION (ORIF) ULNAR FRACTURE;  Surgeon: Eldred Manges;  Location: [facility] OR;  Service: Orthopedics;   Laterality: Right;  . I&d extremity  10/07/2011    Procedure: IRRIGATION AND DEBRIDEMENT EXTREMITY;  Surgeon: Eldred Manges;  Location: [facility] OR;  Service: Orthopedics;  Laterality: Right;  . Rotator cuff repair    . Replacement total knee Left   . Eye surgery      bilateral cataract removal  . Joint replacement      Left Knee Replacement   Social History:  reports that she has never smoked. She has never used smokeless tobacco. She reports that she drinks alcohol. She reports that she does not use illicit drugs.  Allergies  Allergen Reactions  . Nsaids     Due to barret esophagus  . Sulfa Antibiotics   . Zolpidem Tartrate Other (See Comments)    Hallucinations     History reviewed. No pertinent family history.  Prior to Admission medications   Medication Sig Start Date End Date Taking? Authorizing Provider  butalbital-acetaminophen-caffeine (FIORICET, ESGIC) 50-325-40 MG tablet Take 1 tablet by mouth 2 (two) times daily as needed for headache.   Yes Historical Provider, MD  escitalopram (LEXAPRO) 20 MG tablet Take 20 mg by mouth at bedtime.     Yes Historical Provider, MD  Fluticasone-Salmeterol (ADVAIR) 250-50 MCG/DOSE AEPB Inhale 1 puff into the lungs 2 (two) times daily.   Yes Historical Provider, MD  LORazepam (ATIVAN) 0.5 MG tablet Take 0.5 mg by mouth every 6 (six) hours as needed. For anxiety    Yes  Historical Provider, MD  losartan-hydrochlorothiazide (HYZAAR) 50-12.5 MG tablet Take 1 tablet by mouth 3 (three) times a week.   Yes Historical Provider, MD  montelukast (SINGULAIR) 10 MG tablet Take 10 mg by mouth at bedtime.   Yes Historical Provider, MD  oxyCODONE (OXY IR/ROXICODONE) 5 MG immediate release tablet Take 5 mg by mouth every 4 (four) hours as needed for severe pain.   Yes Historical Provider, MD  RABEprazole (ACIPHEX) 20 MG tablet Take 20 mg by mouth daily.    Yes Historical Provider, MD  temazepam (RESTORIL) 30 MG capsule Take 30 mg by mouth at bedtime as needed for  sleep.   Yes Historical Provider, MD  topiramate (TOPAMAX) 100 MG tablet Take 100 mg by mouth daily.   Yes Historical Provider, MD  RABEprazole (ACIPHEX) 20 MG tablet Take 1 tablet (20 mg total) by mouth daily before breakfast. 10/09/11 10/08/12  Eldred Manges, MD    Physical Exam: Filed Vitals:   07/13/15 0015 07/13/15 0030 07/13/15 0045 07/13/15 0251  BP: 118/48 100/46 104/43 104/60  Pulse:  80 85 73  Temp:    98.3 F (36.8 C)  TempSrc:    Oral  Resp: Height:    5' (1.524 m)  Weight:    93.5 kg (206 lb 2.1 oz)  SpO2:  95% 98% 100%    General: Alert, Awake and Oriented to Time, Place and Person. Appear in mild distress Eyes: PERRL ENT: Oral Mucosa clear moist. Neck: no JVD Cardiovascular: S1 and S2 Present, no Murmur, Peripheral Pulses Present Respiratory: Bilateral Air entry equal and Decreased,  Basal Crackles, no wheezes Abdomen: Bowel Sound present, Soft and no tenderness Skin: no Rash Extremities: no Pedal edema, no calf tenderness Neurologic: Grossly no focal neuro deficit.  Labs on Admission:  CBC:  Recent Labs Lab 07/12/15 2200  WBC 12.5*  NEUTROABS 8.3*  HGB 11.0*  HCT 34.5*  MCV 93.8  PLT 181    CMP     Component Value Date/Time   NA 138 07/12/2015 2200   K 3.8 07/12/2015 2200   CL 112* 07/12/2015 2200   CO2 20* 07/12/2015 2200   GLUCOSE 115* 07/12/2015 2200   BUN 28* 07/12/2015 2200   CREATININE 1.71* 07/12/2015 2200   CALCIUM 7.6* 07/12/2015 2200   PROT 6.2* 07/12/2015 2200   ALBUMIN 3.2* 07/12/2015 2200   AST 20 07/12/2015 2200   ALT 10* 07/12/2015 2200   ALKPHOS 92 07/12/2015 2200   BILITOT 0.4 07/12/2015 2200   GFRNONAA 30* 07/12/2015 2200   GFRAA 35* 07/12/2015 2200    No results for input(s): CKTOTAL, CKMB, CKMBINDEX, TROPONINI in the last 168 hours. BNP (last 3 results) No results for input(s): BNP in the last 8760 hours.  ProBNP (last 3 results) No results for input(s): PROBNP in the last 8760  hours.   Radiological Exams on Admission: Dg Chest 2 View  07/12/2015   CLINICAL DATA:  Fever cough chills and chest pain for 3 days.  EXAM: CHEST  2 VIEW  COMPARISON:  10/07/2011  FINDINGS: There is focal airspace opacity in the posterior base region, probably a left lower lobe infiltrate. There is a large hiatal hernia. The right lung is clear. There is no pleural effusion. The pulmonary vasculature is normal.  IMPRESSION: 1. Left lower lobe infiltrate. This may represent pneumonia. Followup PA and lateral chest X-ray is recommended in 3-4 weeks following trial of antibiotic therapy to ensure resolution and exclude underlying malignancy. 2. Hiatal  hernia.   Electronically Signed   By: Ellery Plunk M.D.   On: 07/12/2015 23:36   Assessment/Plan 1. CAP (community acquired pneumonia)  patient presents with complaints of cough and back pain. X-ray shows that she has a left lower lobe infiltrate. This appears to be having lobar pneumonia. With this the patient was treated with levofloxacin. Patient has been treated with Keflex as an outpatient and is currently ongoing with the treatment and despite that she has developed pneumonia therefore I would give her hospital antibiotics coverage. Follow cultures. Given her hypotension, tachycardia, evidence of infection she appears to be having early onset of sepsis. Therefore she'll be admitted in stepdown unit.  2.  Barrett's esophagus and   GERD (gastroesophageal reflux disease) Continue PPI  3  Dyslipidemia Continue home medication  4  Migraine  continuing Topamax as well as Fioricet  5  Insomnia  continue home medication  Nutrition: Regular diet  DVT Prophylaxis: subcutaneous Heparin  Advance goals of care discussion: Full code   Family Communication: family was present at bedside, opportunity was given to ask question and all questions were answered satisfactorily at the time of interview. Disposition: Admitted as inpatient,  step-down unit.  Author: Lynden Oxford, MD [facility] Pager: [phone] 07/13/2015  If 7PM-7AM, please contact night-coverage [email or web address] Password [email or web address]

## 2015-07-13 NOTE — ED Notes (Signed)
Pt's stated weight is 198lbs.

## 2015-07-14 LAB — URINE CULTURE

## 2015-07-14 LAB — LEGIONELLA PNEUMOPHILA SEROGP 1 UR AG: L. PNEUMOPHILA SEROGP 1 UR AG: NEGATIVE

## 2015-07-14 NOTE — Progress Notes (Signed)
[facility] PROGRESS NOTE  Madisun Hargrove WUJ:[id number] DOB: [birth year] DOA: 07/12/2015 PCP: No primary care provider on file.  Assessment/Plan: Principal Problem:   Early Sepsis (HCC) -Improving on antibiotics. - Follow up with cultures   CAP (community acquired pneumonia) - Currently improving on Levaquin.  Active Problems:   GERD (gastroesophageal reflux disease) - Stable on PPI    Dyslipidemia - stable no chest pain reported.    Migraine - stable continue prn fioricet    Insomnia - stable  Code Status: full Family Communication: discussed directly with patient Disposition Plan: Pending improvement in condition.    Consultants:  None  Procedures:  None  Antibiotics:   Levaquin  HPI/Subjective: Pt has no new complaints. Feeling better today  Objective: Filed Vitals:   07/14/15 1223  BP: 115/52  Pulse: 63  Temp: 97.6 F (36.4 C)  Resp: 19    Intake/Output Summary (Last 24 hours) at 07/14/15 1417 Last data filed at 07/14/15 1230  Gross per 24 hour  Intake   2440 ml  Output    400 ml  Net   2040 ml   Filed Weights   07/12/15 1920 07/13/15 0251 07/14/15 0500  Weight: 89.812 kg (198 lb) 93.5 kg (206 lb 2.1 oz) 94.1 kg (207 lb 7.3 oz)    Exam:   General:  Pt in nad, alert and awake  Cardiovascular: rrr, no mrg  Respiratory: no increased wob, no wheezes, equal chest rise  Abdomen: soft, ND, NT  Musculoskeletal: no cyanosis or clubbing   Data Reviewed: Basic Metabolic Panel:  Recent Labs Lab 07/12/15 2200 07/13/15 0605  NA 138 139  K 3.8 3.4*  CL 112* 116*  CO2 20* 18*  GLUCOSE 115* 117*  BUN 28* 22*  CREATININE 1.71* 1.44*  CALCIUM 7.6* 6.8*   Liver Function Tests:  Recent Labs Lab 07/12/15 2200 07/13/15 0605  AST 20 19  ALT 10* 10*  ALKPHOS 92 81  BILITOT 0.4 0.3  PROT 6.2* 5.0*  ALBUMIN 3.2* 2.5*    Recent Labs Lab 07/12/15 2200  LIPASE 28   No results for input(s): AMMONIA in the last 168  hours. CBC:  Recent Labs Lab 07/12/15 2200 07/13/15 0605  WBC 12.5* 8.7  NEUTROABS 8.3* 5.0  HGB 11.0* 9.7*  HCT 34.5* 31.0*  MCV 93.8 93.1  PLT 181 167   Cardiac Enzymes: No results for input(s): CKTOTAL, CKMB, CKMBINDEX, TROPONINI in the last 168 hours. BNP (last 3 results) No results for input(s): BNP in the last 8760 hours.  ProBNP (last 3 results) No results for input(s): PROBNP in the last 8760 hours.  CBG:  Recent Labs Lab 07/13/15 1642  GLUCAP 92    Recent Results (from the past 240 hour(s))  Urine culture     Status: None   Collection Time: 07/12/15 11:25 PM  Result Value Ref Range Status   Specimen Description URINE, CLEAN CATCH  Final   Special Requests NONE  Final   Culture   Final    MULTIPLE SPECIES PRESENT, SUGGEST RECOLLECTION Performed at [facility]    Report Status 07/14/2015 FINAL  Final  Blood Culture (routine x 2)     Status: None (Preliminary result)   Collection Time: 07/13/15 12:20 AM  Result Value Ref Range Status   Specimen Description BLOOD LEFT ANTECUBITAL  Final   Special Requests BOTTLES DRAWN AEROBIC AND ANAEROBIC 10CC  Final   Culture   Final    NO GROWTH 1 DAY Performed at [facility]  Report Status PENDING  Incomplete  Blood Culture (routine x 2)     Status: None (Preliminary result)   Collection Time: 07/13/15 12:25 AM  Result Value Ref Range Status   Specimen Description BLOOD RIGHT ANTECUBITAL  Final   Special Requests BOTTLES DRAWN AEROBIC AND ANAEROBIC 10CC  Final   Culture   Final    NO GROWTH 1 DAY Performed at [facility]    Report Status PENDING  Incomplete  MRSA PCR Screening     Status: Abnormal   Collection Time: 07/13/15  2:24 AM  Result Value Ref Range Status   MRSA by PCR POSITIVE (A) NEGATIVE Final    Comment:        The GeneXpert MRSA Assay (FDA approved for NASAL specimens only), is one component of a comprehensive MRSA colonization surveillance program. It is  not intended to diagnose MRSA infection nor to guide or monitor treatment for MRSA infections. CRITICAL RESULT CALLED TO, READ BACK BY AND VERIFIED WITH: BUFFY.S RN @ 06:56 10/ CRITICAL RESULT CALLED TO, READ BACK BY AND VERIFIED WITH: BUFFY.S RN @ 6:56 07/13/2015 BY [email or web address]      Studies: Dg Chest 2 View  07/12/2015   CLINICAL DATA:  Fever cough chills and chest pain for 3 days.  EXAM: CHEST  2 VIEW  COMPARISON:  10/07/2011  FINDINGS: There is focal airspace opacity in the posterior base region, probably a left lower lobe infiltrate. There is a large hiatal hernia. The right lung is clear. There is no pleural effusion. The pulmonary vasculature is normal.  IMPRESSION: 1. Left lower lobe infiltrate. This may represent pneumonia. Followup PA and lateral chest X-ray is recommended in 3-4 weeks following trial of antibiotic therapy to ensure resolution and exclude underlying malignancy. 2. Hiatal hernia.   Electronically Signed   By: Ellery Plunk M.D.   On: 07/12/2015 23:36    Scheduled Meds: . Chlorhexidine Gluconate Cloth  6 each Topical Q0600  . escitalopram  20 mg Oral QHS  . heparin  5,000 Units Subcutaneous 3 times per day  . levofloxacin (LEVAQUIN) IV  750 mg Intravenous Q48H  . mometasone-formoterol  2 puff Inhalation BID  . montelukast  10 mg Oral QHS  . mupirocin ointment  1 application Nasal BID  . pantoprazole  40 mg Oral Daily  . topiramate  100 mg Oral Daily   Continuous Infusions: . sodium chloride 100 mL/hr at 07/14/15 0820    Time spent: > 35 minutes    Penny Pia  [facility] Pager [phone] If 7PM-7AM, please contact night-coverage at [email or web address], password [facility] 07/14/2015, 2:17 PM  LOS: 1 day

## 2015-07-14 NOTE — Progress Notes (Signed)
Report received from RN Johnny Bridge 2C.

## 2015-07-15 DIAGNOSIS — N179 Acute kidney failure, unspecified: Secondary | ICD-10-CM

## 2015-07-15 DIAGNOSIS — E872 Acidosis: Secondary | ICD-10-CM

## 2015-07-15 DIAGNOSIS — A403 Sepsis due to Streptococcus pneumoniae: Secondary | ICD-10-CM

## 2015-07-15 DIAGNOSIS — K219 Gastro-esophageal reflux disease without esophagitis: Secondary | ICD-10-CM

## 2015-07-15 LAB — BASIC METABOLIC PANEL
Anion gap: 7 (ref 5–15)
Anion gap: 8 (ref 5–15)
BUN: 9 mg/dL (ref 6–20)
BUN: 9 mg/dL (ref 6–20)
CALCIUM: 6.4 mg/dL — AB (ref 8.9–10.3)
CALCIUM: 6.5 mg/dL — AB (ref 8.9–10.3)
CO2: 13 mmol/L — AB (ref 22–32)
CO2: 12 mmol/L — AB (ref 22–32)
CREATININE: 1.22 mg/dL — AB (ref 0.44–1.00)
CREATININE: 1.22 mg/dL — AB (ref 0.44–1.00)
Chloride: 120 mmol/L — ABNORMAL HIGH (ref 101–111)
Chloride: 120 mmol/L — ABNORMAL HIGH (ref 101–111)
GFR calc non Af Amer: 45 mL/min — ABNORMAL LOW (ref >60)
GFR calc non Af Amer: 45 mL/min — ABNORMAL LOW (ref >60)
GFR, EST AFRICAN AMERICAN: 53 mL/min — AB (ref >60)
GFR, EST AFRICAN AMERICAN: 53 mL/min — AB (ref >60)
GLUCOSE: 87 mg/dL (ref 65–99)
GLUCOSE: 96 mg/dL (ref 65–99)
Potassium: 4.1 mmol/L (ref 3.5–5.1)
Potassium: 4.6 mmol/L (ref 3.5–5.1)
Sodium: 140 mmol/L (ref 135–145)
Sodium: 140 mmol/L (ref 135–145)

## 2015-07-15 LAB — CBC
HEMATOCRIT: 29.4 % — AB (ref 36.0–46.0)
Hemoglobin: 9.6 g/dL — ABNORMAL LOW (ref 12.0–15.0)
MCH: 30.2 pg (ref 26.0–34.0)
MCHC: 32.7 g/dL (ref 30.0–36.0)
MCV: 92.5 fL (ref 78.0–100.0)
PLATELETS: 169 10*3/uL (ref 150–400)
RBC: 3.18 MIL/uL — ABNORMAL LOW (ref 3.87–5.11)
RDW: 13.3 % (ref 11.5–15.5)
WBC: 7.5 10*3/uL (ref 4.0–10.5)

## 2015-07-15 MED ORDER — CALCIUM CARBONATE 1250 (500 CA) MG PO TABS
500.0000 mg | ORAL_TABLET | Freq: Three times a day (TID) | ORAL | Status: DC
  Administered 2015-07-15 – 2015-07-17 (×7): 500 mg via ORAL
  Filled 2015-07-15 (×7): qty 1

## 2015-07-15 MED ORDER — SODIUM BICARBONATE 8.4 % IV SOLN
INTRAVENOUS | Status: DC
  Administered 2015-07-15: 20:00:00 via INTRAVENOUS
  Filled 2015-07-15 (×2): qty 100

## 2015-07-15 NOTE — Progress Notes (Addendum)
PROGRESS NOTE    Melinda Berger [id number]:[id number] DOB: June 14, 1951 DOA: 07/12/2015 PCP: No primary care provider on file.  HPI/Brief narrative  64 year old female with history of HTN, asthma, Barrett's esophagus, anxiety, GERD, chronic kidney disease , presented to [facility] ED on 07/13/15 with generalized fatigue, malaise , back pain, productive cough and dyspnea. In the ED, she was hypotensive and chest x-ray was suggestive of pneumonia.   Assessment/Plan:  Community-acquired pneumonia (pneumococcal ) -left lower lobe -  Apparently failed outpatient oral Keflex treatment -  Empirically treated with IV levofloxacin and improving -  Met sepsis criteria on admission : hypotension , tachycardia and chest x-ray consistent with pneumonia -  Continue additional 24 hours of IV antibiotics and then transitioned to oral antibiotics at discharge -  Will need follow-up chest x-ray in 3-4 weeks to ensure resolution of pneumonia findings -  Urine strep pneumococcal antigen positive. Urine Legionella antigen: Negative -  Blood cultures 2: Negative   Sepsis, present on admission -  Secondary to community-acquired pneumonia. -  Sepsis physiology resolved.   Acute on stage III chronic kidney disease -  Creatinine on admission 1.7. Baseline creatinine not available. Creatinine has improved to 1.22 with IV hydration. Follow BMP in a.m.   Hypokalemia -  Replaced   Non-anion gap metabolic acidosis -  Bicarbonate has progressively decreased from 20 on admission to 13 on 10/5. Unclear etiology -? Secondary to hyperchloremia. Will repeat BMP to verify. If truly low, consider bicarbonate drip. DC NS -  Lactate normal on 10/3   Hypocalcemia -  Corrected serum calcium of 7.6. Started Os-Cal. Asymptomatic. Follow BMP   Chronic anemia -  Stable   Barrett's esophagus/GERD -  PPI   Dyslipidemia -  Continue home medications  Asthma -  stable    DVT prophylaxis: Subcutaneous heparin Code Status:   full Family Communication:  None at bedside Disposition Plan:  DC home when medically stable   Consultants:   none  Procedures:   none  Antibiotics:   IV levofloxacin 07/12/15 >   Subjective:  overall feels much better. Dyspnea significantly improved but not at baseline. Mild intermittent anterior chest pain only with deep inspiration. Dry cough and poor appetite.  Objective: Filed Vitals:   07/14/15 1724 07/14/15 2022 07/15/15 0455 07/15/15 0959  BP: 132/65 128/55 140/69 135/66  Pulse: 64 63 69 74  Temp: 98.4 F (36.9 C) 98.7 F (37.1 C) 98.5 F (36.9 C) 98 F (36.7 C)  TempSrc: Oral Oral Oral Oral  Resp: '18 17 18 18  ' Height:      Weight:  96.662 kg (213 lb 1.6 oz)    SpO2: 100% 98% 99% 98%    Intake/Output Summary (Last 24 hours) at 07/15/15 1546 Last data filed at 07/15/15 1300  Gross per 24 hour  Intake   2710 ml  Output      0 ml  Net   2710 ml   Filed Weights   07/13/15 0251 07/14/15 0500 07/14/15 2022  Weight: 93.5 kg (206 lb 2.1 oz) 94.1 kg (207 lb 7.3 oz) 96.662 kg (213 lb 1.6 oz)     Exam:  General exam:  Pleasant middle-aged female sitting up comfortably in chair. Does not appear septic or toxic. Respiratory system: Clear. No increased work of breathing. Cardiovascular system: S1 & S2 heard, RRR. No JVD, murmurs, gallops, clicks or pedal edema. Telemetry: Sinus rhythm. Gastrointestinal system: Abdomen is nondistended, soft and nontender. Normal bowel sounds heard. Central nervous system: Alert and oriented.  No focal neurological deficits. Extremities: Symmetric 5 x 5 power.   Data Reviewed: Basic Metabolic Panel:  Recent Labs Lab 07/12/15 2200 07/13/15 0605 07/15/15 0745  NA 138 139 140  K 3.8 3.4* 4.1  CL 112* 116* 120*  CO2 20* 18* 13*  GLUCOSE 115* 117* 87  BUN 28* 22* 9  CREATININE 1.71* 1.44* 1.22*  CALCIUM 7.6* 6.8* 6.4*   Liver Function Tests:  Recent Labs Lab 07/12/15 2200 07/13/15 0605  AST 20 19  ALT 10* 10*   ALKPHOS 92 81  BILITOT 0.4 0.3  PROT 6.2* 5.0*  ALBUMIN 3.2* 2.5*    Recent Labs Lab 07/12/15 2200  LIPASE 28   No results for input(s): AMMONIA in the last 168 hours. CBC:  Recent Labs Lab 07/12/15 2200 07/13/15 0605 07/15/15 0745  WBC 12.5* 8.7 7.5  NEUTROABS 8.3* 5.0  --   HGB 11.0* 9.7* 9.6*  HCT 34.5* 31.0* 29.4*  MCV 93.8 93.1 92.5  PLT 181 167 169   Cardiac Enzymes: No results for input(s): CKTOTAL, CKMB, CKMBINDEX, TROPONINI in the last 168 hours. BNP (last 3 results) No results for input(s): PROBNP in the last 8760 hours. CBG:  Recent Labs Lab 07/13/15 1642  GLUCAP 92    Recent Results (from the past 240 hour(s))  Urine culture     Status: None   Collection Time: 07/12/15 11:25 PM  Result Value Ref Range Status   Specimen Description URINE, CLEAN CATCH  Final   Special Requests NONE  Final   Culture   Final    MULTIPLE SPECIES PRESENT, SUGGEST RECOLLECTION Performed at [facility]    Report Status 07/14/2015 FINAL  Final  Blood Culture (routine x 2)     Status: None (Preliminary result)   Collection Time: 07/13/15 12:20 AM  Result Value Ref Range Status   Specimen Description BLOOD LEFT ANTECUBITAL  Final   Special Requests BOTTLES DRAWN AEROBIC AND ANAEROBIC 10CC  Final   Culture   Final    NO GROWTH 2 DAYS Performed at [facility]    Report Status PENDING  Incomplete  Blood Culture (routine x 2)     Status: None (Preliminary result)   Collection Time: 07/13/15 12:25 AM  Result Value Ref Range Status   Specimen Description BLOOD RIGHT ANTECUBITAL  Final   Special Requests BOTTLES DRAWN AEROBIC AND ANAEROBIC 10CC  Final   Culture   Final    NO GROWTH 2 DAYS Performed at [facility]    Report Status PENDING  Incomplete  MRSA PCR Screening     Status: Abnormal   Collection Time: 07/13/15  2:24 AM  Result Value Ref Range Status   MRSA by PCR POSITIVE (A) NEGATIVE Final    Comment:        The GeneXpert MRSA Assay  (FDA approved for NASAL specimens only), is one component of a comprehensive MRSA colonization surveillance program. It is not intended to diagnose MRSA infection nor to guide or monitor treatment for MRSA infections. CRITICAL RESULT CALLED TO, READ BACK BY AND VERIFIED WITH: BUFFY.S RN @ 06:56 10/ CRITICAL RESULT CALLED TO, READ BACK BY AND VERIFIED WITH: BUFFY.S RN @ 6:56 07/13/2015 BY K.PEELE            Studies: No results found.      Scheduled Meds: . calcium carbonate  500 mg of elemental calcium Oral TID WC  . Chlorhexidine Gluconate Cloth  6 each Topical Q0600  . escitalopram  20 mg  Oral QHS  . heparin  5,000 Units Subcutaneous 3 times per day  . levofloxacin (LEVAQUIN) IV  750 mg Intravenous Q48H  . mometasone-formoterol  2 puff Inhalation BID  . montelukast  10 mg Oral QHS  . mupirocin ointment  1 application Nasal BID  . pantoprazole  40 mg Oral Daily  . topiramate  100 mg Oral Daily   Continuous Infusions: . sodium chloride 100 mL/hr at 07/15/15 0533    Principal Problem:   CAP (community acquired pneumonia) Active Problems:   GERD (gastroesophageal reflux disease)   Dyslipidemia   Migraine   Insomnia   Early Sepsis ([place])    Time spent:  39 minutes    Melinda Lazo, MD, FACP, FHM. [facility] Pager [phone]  If 7PM-7AM, please contact night-coverage [email or web address] Password [email or web address] 07/15/2015, 3:46 PM    LOS: 2 days

## 2015-07-15 NOTE — Progress Notes (Signed)
CRITICAL VALUE ALERT  Critical value received:  Calcium 6.1  Date of notification:  07/15/2015  Time of notification:  0833  Critical value read back:Yes.    Nurse who received alert:  Lavona Mound   MD notified (1st page):  Dr. Waymon Amato, text page  Time of first page:  [phone]  MD notified (2nd page):  Time of second page:  Responding MD:

## 2015-07-16 DIAGNOSIS — J13 Pneumonia due to Streptococcus pneumoniae: Secondary | ICD-10-CM

## 2015-07-16 DIAGNOSIS — N183 Chronic kidney disease, stage 3 (moderate): Secondary | ICD-10-CM

## 2015-07-16 LAB — BASIC METABOLIC PANEL
ANION GAP: 7 (ref 5–15)
BUN: 8 mg/dL (ref 6–20)
CHLORIDE: 114 mmol/L — AB (ref 101–111)
CO2: 18 mmol/L — ABNORMAL LOW (ref 22–32)
Calcium: 6.8 mg/dL — ABNORMAL LOW (ref 8.9–10.3)
Creatinine, Ser: 1.25 mg/dL — ABNORMAL HIGH (ref 0.44–1.00)
GFR calc Af Amer: 51 mL/min — ABNORMAL LOW (ref >60)
GFR calc non Af Amer: 44 mL/min — ABNORMAL LOW (ref >60)
GLUCOSE: 91 mg/dL (ref 65–99)
POTASSIUM: 4.3 mmol/L (ref 3.5–5.1)
Sodium: 139 mmol/L (ref 135–145)

## 2015-07-16 LAB — HIV ANTIBODY (ROUTINE TESTING W REFLEX): HIV Screen 4th Generation wRfx: NONREACTIVE

## 2015-07-16 MED ORDER — LEVOFLOXACIN 750 MG PO TABS
750.0000 mg | ORAL_TABLET | ORAL | Status: AC
  Administered 2015-07-16 – 2015-07-18 (×2): 750 mg via ORAL
  Filled 2015-07-16 (×3): qty 1

## 2015-07-16 MED ORDER — SODIUM BICARBONATE 8.4 % IV SOLN
INTRAVENOUS | Status: DC
  Administered 2015-07-16 – 2015-07-17 (×2): via INTRAVENOUS
  Filled 2015-07-16 (×3): qty 100

## 2015-07-16 NOTE — Progress Notes (Signed)
PROGRESS NOTE    Melinda Berger [id number]:[id number] DOB: September 20, 1951 DOA: 07/12/2015 PCP: No primary care provider on file.  HPI/Brief narrative  64 year old female with history of HTN, asthma, Barrett's esophagus, anxiety, GERD, chronic kidney disease , presented to [facility] ED on 07/13/15 with generalized fatigue, malaise , back pain, productive cough and dyspnea. In the ED, she was hypotensive and chest x-ray was suggestive of pneumonia.   Assessment/Plan:  Community-acquired pneumonia (pneumococcal ) -left lower lobe -  Apparently failed outpatient oral Keflex treatment -  Empirically treated with IV levofloxacin and improving -  Met sepsis criteria on admission : hypotension , tachycardia and chest x-ray consistent with pneumonia -  Continue additional 24 hours of IV antibiotics and then transition to oral antibiotics at discharge -  Will need follow-up chest x-ray in 3-4 weeks to ensure resolution of pneumonia findings -  Urine strep pneumococcal antigen positive. Urine Legionella antigen: Negative -  Blood cultures 2: Negative   Sepsis, present on admission-secondary to pneumococcal pneumonia -  Secondary to community-acquired pneumonia. -  Sepsis physiology resolved.   Acute on stage III chronic kidney disease -  Creatinine on admission 1.7. Baseline creatinine not available. Creatinine stabilized at 1.2 range.  Hypokalemia -  Replaced   Non-anion gap metabolic acidosis -  Bicarbonate has progressively decreased from 20 on admission to 123 on 10/5. Unclear etiology -? Secondary to hyperchloremia.  -  Lactate normal on 10/3 - Improved with bicarbonate drip. Continue for additional 24 hours.   Hypocalcemia -  Corrected serum calcium of 7.6. Started Os-Cal. Asymptomatic. Follow BMP-improving   Chronic anemia -  Stable   Barrett's esophagus/GERD -  PPI   Dyslipidemia -  Continue home medications  Asthma -  stable    DVT prophylaxis: Subcutaneous heparin Code Status:   full Family Communication:  None at bedside Disposition Plan:  DC home possibly 10/7   Consultants:   none  Procedures:   none  Antibiotics:   IV levofloxacin 07/12/15 >   Subjective: Continues to feel better. No specific complaints. Appetite still poor.  Objective: Filed Vitals:   07/15/15 2113 07/15/15 2156 07/16/15 0513 07/16/15 0942  BP: 131/56  130/64 121/60  Pulse: 61 72 67 79  Temp: 98.7 F (37.1 C)  97.8 F (36.6 C) 98.3 F (36.8 C)  TempSrc: Oral  Oral Oral  Resp: _0 Height:      Weight:      SpO2: 100%  100% 99%    Intake/Output Summary (Last 24 hours) at 07/16/15 1132 Last data filed at 07/16/15 0810  Gross per 24 hour  Intake 1852.5 ml  Output      0 ml  Net 1852.5 ml   Filed Weights   07/13/15 0251 07/14/15 0500 07/14/15 2022  Weight: 93.5 kg (206 lb 2.1 oz) 94.1 kg (207 lb 7.3 oz) 96.662 kg (213 lb 1.6 oz)     Exam:  General exam:  Pleasant middle-aged female sitting up comfortably in chair. Does not appear septic or toxic. Respiratory system: Clear. No increased work of breathing. Cardiovascular system: S1 & S2 heard, RRR. No JVD, murmurs, gallops, clicks or pedal edema.  Gastrointestinal system: Abdomen is nondistended, soft and nontender. Normal bowel sounds heard. Central nervous system: Alert and oriented. No focal neurological deficits. Extremities: Symmetric 5 x 5 power.   Data Reviewed: Basic Metabolic Panel:  Recent Labs Lab 07/12/15 2200 07/13/15 0605 07/15/15 0745 07/15/15 1700 07/16/15 0610  NA 138 139 140 140 139  K 3.8 3.4* 4.1 4.6 4.3  CL 112* 116* 120* 120* 114*  CO2 20* 18* 13* 12* 18*  GLUCOSE 115* 117* 87 96 91  BUN 28* 22* _0 CREATININE 1.71* 1.44* 1.22* 1.22* 1.25*  CALCIUM 7.6* 6.8* 6.4* 6.5* 6.8*   Liver Function Tests:  Recent Labs Lab 07/12/15 2200 07/13/15 0605  AST 20 19  ALT 10* 10*  ALKPHOS 92 81  BILITOT 0.4 0.3  PROT 6.2* 5.0*  ALBUMIN 3.2* 2.5*    Recent Labs Lab  07/12/15 2200  LIPASE 28   No results for input(s): AMMONIA in the last 168 hours. CBC:  Recent Labs Lab 07/12/15 2200 07/13/15 0605 07/15/15 0745  WBC 12.5* 8.7 7.5  NEUTROABS 8.3* 5.0  --   HGB 11.0* 9.7* 9.6*  HCT 34.5* 31.0* 29.4*  MCV 93.8 93.1 92.5  PLT 181 167 169   Cardiac Enzymes: No results for input(s): CKTOTAL, CKMB, CKMBINDEX, TROPONINI in the last 168 hours. BNP (last 3 results) No results for input(s): PROBNP in the last 8760 hours. CBG:  Recent Labs Lab 07/13/15 1642  GLUCAP 92    Recent Results (from the past 240 hour(s))  Urine culture     Status: None   Collection Time: 07/12/15 11:25 PM  Result Value Ref Range Status   Specimen Description URINE, CLEAN CATCH  Final   Special Requests NONE  Final   Culture   Final    MULTIPLE SPECIES PRESENT, SUGGEST RECOLLECTION Performed at [facility]    Report Status 07/14/2015 FINAL  Final  Blood Culture (routine x 2)     Status: None (Preliminary result)   Collection Time: 07/13/15 12:20 AM  Result Value Ref Range Status   Specimen Description BLOOD LEFT ANTECUBITAL  Final   Special Requests BOTTLES DRAWN AEROBIC AND ANAEROBIC 10CC  Final   Culture   Final    NO GROWTH 2 DAYS Performed at [facility]    Report Status PENDING  Incomplete  Blood Culture (routine x 2)     Status: None (Preliminary result)   Collection Time: 07/13/15 12:25 AM  Result Value Ref Range Status   Specimen Description BLOOD RIGHT ANTECUBITAL  Final   Special Requests BOTTLES DRAWN AEROBIC AND ANAEROBIC 10CC  Final   Culture   Final    NO GROWTH 2 DAYS Performed at [facility]    Report Status PENDING  Incomplete  MRSA PCR Screening     Status: Abnormal   Collection Time: 07/13/15  2:24 AM  Result Value Ref Range Status   MRSA by PCR POSITIVE (A) NEGATIVE Final    Comment:        The GeneXpert MRSA Assay (FDA approved for NASAL specimens only), is one component of a comprehensive MRSA  colonization surveillance program. It is not intended to diagnose MRSA infection nor to guide or monitor treatment for MRSA infections. CRITICAL RESULT CALLED TO, READ BACK BY AND VERIFIED WITH: BUFFY.S RN @ 06:56 10/ CRITICAL RESULT CALLED TO, READ BACK BY AND VERIFIED WITH: BUFFY.S RN @ 6:56 07/13/2015 BY K.PEELE            Studies: No results found.      Scheduled Meds: . calcium carbonate  500 mg of elemental calcium Oral TID WC  . Chlorhexidine Gluconate Cloth  6 each Topical Q0600  . escitalopram  20 mg Oral QHS  . heparin  5,000 Units Subcutaneous 3 times per day  . levofloxacin (LEVAQUIN) IV  750 mg Intravenous Q48H  . mometasone-formoterol  2 puff Inhalation BID  . montelukast  10 mg Oral QHS  . mupirocin ointment  1 application Nasal BID  . pantoprazole  40 mg Oral Daily  . topiramate  100 mg Oral Daily   Continuous Infusions: .  sodium bicarbonate  infusion 1000 mL 75 mL/hr at 07/16/15 0810    Principal Problem:   CAP (community acquired pneumonia) Active Problems:   GERD (gastroesophageal reflux disease)   Dyslipidemia   Migraine   Insomnia   Early Sepsis ([place])    Time spent:  80 minutes    Yolanda Huffstetler, MD, FACP, FHM. [facility] Pager [phone]  If 7PM-7AM, please contact night-coverage [email or web address] Password [email or web address] 07/16/2015, 11:32 AM    LOS: 3 days

## 2015-07-17 ENCOUNTER — Inpatient Hospital Stay (HOSPITAL_COMMUNITY): Payer: Managed Care, Other (non HMO)

## 2015-07-17 LAB — BASIC METABOLIC PANEL
ANION GAP: 8 (ref 5–15)
BUN: 7 mg/dL (ref 6–20)
CALCIUM: 6.9 mg/dL — AB (ref 8.9–10.3)
CO2: 19 mmol/L — ABNORMAL LOW (ref 22–32)
Chloride: 111 mmol/L (ref 101–111)
Creatinine, Ser: 1.26 mg/dL — ABNORMAL HIGH (ref 0.44–1.00)
GFR calc Af Amer: 51 mL/min — ABNORMAL LOW (ref >60)
GFR, EST NON AFRICAN AMERICAN: 44 mL/min — AB (ref >60)
GLUCOSE: 86 mg/dL (ref 65–99)
Potassium: 3.8 mmol/L (ref 3.5–5.1)
Sodium: 138 mmol/L (ref 135–145)

## 2015-07-17 LAB — D-DIMER, QUANTITATIVE: D-Dimer, Quant: 2.56 ug/mL-FEU — ABNORMAL HIGH (ref 0.00–0.48)

## 2015-07-17 MED ORDER — SODIUM CHLORIDE 0.45 % IV SOLN
INTRAVENOUS | Status: DC
  Administered 2015-07-17 – 2015-07-18 (×2): via INTRAVENOUS

## 2015-07-17 MED ORDER — IOHEXOL 350 MG/ML SOLN
100.0000 mL | Freq: Once | INTRAVENOUS | Status: AC | PRN
  Administered 2015-07-17: 100 mL via INTRAVENOUS

## 2015-07-17 MED ORDER — CALCIUM CARBONATE-VITAMIN D 500-200 MG-UNIT PO TABS
2.0000 | ORAL_TABLET | Freq: Three times a day (TID) | ORAL | Status: DC
  Administered 2015-07-17 – 2015-07-20 (×9): 2 via ORAL
  Filled 2015-07-17: qty 2
  Filled 2015-07-17: qty 1
  Filled 2015-07-17 (×3): qty 2
  Filled 2015-07-17: qty 1
  Filled 2015-07-17 (×4): qty 2

## 2015-07-17 NOTE — Progress Notes (Signed)
PROGRESS NOTE    Melinda Berger [id number]:[id number] DOB: 1951-07-13 DOA: 07/12/2015 PCP: No primary care provider on file.  HPI/Brief narrative  64 year old female with history of HTN, asthma, Barrett's esophagus, anxiety, GERD, chronic kidney disease , presented to [facility] ED on 07/13/15 with generalized fatigue, malaise , back pain, productive cough and dyspnea. In the ED, she was hypotensive and chest x-ray was suggestive of pneumonia. She was treated with IV levofloxacin for presumed community-acquired left lower lobe streptococcal pneumonia. She made slow gradual improvement for the first couple of days. On 10/7 she complained of feeling worse-worsening dyspnea and mid scapular pain. She also provided new history of right leg DVT in 2012-completed 6 months of Coumadin. Concern for PE and hence obtain d-dimer which was positive. Being followed up with CTA chest.   Assessment/Plan:  Community-acquired pneumonia (pneumococcal ) -left lower lobe -  Apparently failed outpatient oral Keflex treatment -  Empirically treated with IV levofloxacin -  Met sepsis criteria on admission : hypotension , tachycardia and chest x-ray consistent with pneumonia -  Will need follow-up chest x-ray in 3-4 weeks to ensure resolution of pneumonia findings -  Urine strep pneumococcal antigen positive. Urine Legionella antigen: Negative -  Blood cultures 2: Negative - Was slowly improving until today when complaining of worsening dyspnea and mid scapular pain. D-dimer positive. We will check CTA chest to evaluate pneumonia, rule out PE.   Sepsis, present on admission-secondary to pneumococcal pneumonia -  Secondary to community-acquired pneumonia. -  Sepsis physiology resolved.   Acute on stage III chronic kidney disease -  Creatinine on admission 1.7. Baseline creatinine: 1.2-1.4. Creatinine stabilized at 1.2 range.  Hypokalemia -  Replaced   Non-anion gap metabolic acidosis -  Bicarbonate has progressively  decreased from 20 on admission to 123 on 10/5. Unclear etiology -? Secondary to hyperchloremia.  -  Lactate normal on 10/3 - Improved after bicarbonate drip   Hypocalcemia/vitamin D deficiency -  Corrected serum calcium of 7.6. Started Os-Cal with D. Asymptomatic. Outpatient follow-up with vitamin D levels (last time a couple of months ago was in the teens)   Chronic anemia -  Stable   Barrett's esophagus/GERD -  PPI   Dyslipidemia -  Continue home medications  Asthma -  Stable  History of right leg DVT in 2012 - Completed Coumadin anticoagulation    DVT prophylaxis: Subcutaneous heparin Code Status:  full Family Communication:  Discussed with patient's daughter Ms. Windell Hummingbird who is a Librarian, academic for an internal medicine practice. Updated care and answered questions. Disposition Plan:  DC home when medically stable.   Consultants:   None  Procedures:   None  Antibiotics:   IV levofloxacin 07/12/15 >   Subjective: States that she does not feel well today. Claims worsening dyspnea and return of mid scapular pain which she states brought her to the hospital in the first place. Today gives a history of right leg DVT in 2012 and completed Lovenox bridging followed by 6 months of Coumadin. No history of PE. Denies recent long distance travel.  Objective: Filed Vitals:   07/16/15 2153 07/17/15 0517 07/17/15 0813 07/17/15 1040  BP: 142/60 129/56  125/55  Pulse: 58 69  67  Temp: 98.7 F (37.1 C) 99.6 F (37.6 C)  98.6 F (37 C)  TempSrc: Oral Oral  Oral  Resp: '14 16  18  ' Height:      Weight:      SpO2: 98% 100% 99% 100%    Intake/Output  Summary (Last 24 hours) at 07/17/15 1539 Last data filed at 07/17/15 1041  Gross per 24 hour  Intake 1652.5 ml  Output      0 ml  Net 1652.5 ml   Filed Weights   07/13/15 0251 07/14/15 0500 07/14/15 2022  Weight: 93.5 kg (206 lb 2.1 oz) 94.1 kg (207 lb 7.3 oz) 96.662 kg (213 lb 1.6 oz)     Exam:  General  exam:  Pleasant middle-aged female sitting up comfortably in chair. Does not appear septic or toxic. Respiratory system: Clear. No increased work of breathing. Cardiovascular system: S1 & S2 heard, RRR. No JVD, murmurs, gallops, clicks or pedal edema.  Gastrointestinal system: Abdomen is nondistended, soft and nontender. Normal bowel sounds heard. Central nervous system: Alert and oriented. No focal neurological deficits. Extremities: Symmetric 5 x 5 power.   Data Reviewed: Basic Metabolic Panel:  Recent Labs Lab 07/13/15 0605 07/15/15 0745 07/15/15 1700 07/16/15 0610 07/17/15 0525  NA 139 140 140 139 138  K 3.4* 4.1 4.6 4.3 3.8  CL 116* 120* 120* 114* 111  CO2 18* 13* 12* 18* 19*  GLUCOSE 117* 87 96 91 86  BUN 22* '9 9 8 7  ' CREATININE 1.44* 1.22* 1.22* 1.25* 1.26*  CALCIUM 6.8* 6.4* 6.5* 6.8* 6.9*   Liver Function Tests:  Recent Labs Lab 07/12/15 2200 07/13/15 0605  AST 20 19  ALT 10* 10*  ALKPHOS 92 81  BILITOT 0.4 0.3  PROT 6.2* 5.0*  ALBUMIN 3.2* 2.5*    Recent Labs Lab 07/12/15 2200  LIPASE 28   No results for input(s): AMMONIA in the last 168 hours. CBC:  Recent Labs Lab 07/12/15 2200 07/13/15 0605 07/15/15 0745  WBC 12.5* 8.7 7.5  NEUTROABS 8.3* 5.0  --   HGB 11.0* 9.7* 9.6*  HCT 34.5* 31.0* 29.4*  MCV 93.8 93.1 92.5  PLT 181 167 169   Cardiac Enzymes: No results for input(s): CKTOTAL, CKMB, CKMBINDEX, TROPONINI in the last 168 hours. BNP (last 3 results) No results for input(s): PROBNP in the last 8760 hours. CBG:  Recent Labs Lab 07/13/15 1642  GLUCAP 92    Recent Results (from the past 240 hour(s))  Urine culture     Status: None   Collection Time: 07/12/15 11:25 PM  Result Value Ref Range Status   Specimen Description URINE, CLEAN CATCH  Final   Special Requests NONE  Final   Culture   Final    MULTIPLE SPECIES PRESENT, SUGGEST RECOLLECTION Performed at [facility]    Report Status 07/14/2015 FINAL  Final  Blood  Culture (routine x 2)     Status: None (Preliminary result)   Collection Time: 07/13/15 12:20 AM  Result Value Ref Range Status   Specimen Description BLOOD LEFT ANTECUBITAL  Final   Special Requests BOTTLES DRAWN AEROBIC AND ANAEROBIC 10CC  Final   Culture   Final    NO GROWTH 4 DAYS Performed at [facility]    Report Status PENDING  Incomplete  Blood Culture (routine x 2)     Status: None (Preliminary result)   Collection Time: 07/13/15 12:25 AM  Result Value Ref Range Status   Specimen Description BLOOD RIGHT ANTECUBITAL  Final   Special Requests BOTTLES DRAWN AEROBIC AND ANAEROBIC 10CC  Final   Culture   Final    NO GROWTH 4 DAYS Performed at [facility]    Report Status PENDING  Incomplete  MRSA PCR Screening     Status: Abnormal  Collection Time: 07/13/15  2:24 AM  Result Value Ref Range Status   MRSA by PCR POSITIVE (A) NEGATIVE Final    Comment:        The GeneXpert MRSA Assay (FDA approved for NASAL specimens only), is one component of a comprehensive MRSA colonization surveillance program. It is not intended to diagnose MRSA infection nor to guide or monitor treatment for MRSA infections. CRITICAL RESULT CALLED TO, READ BACK BY AND VERIFIED WITH: BUFFY.S RN @ 06:56 10/ CRITICAL RESULT CALLED TO, READ BACK BY AND VERIFIED WITH: BUFFY.S RN @ 6:56 07/13/2015 BY K.PEELE            Studies: No results found.      Scheduled Meds: . calcium carbonate  500 mg of elemental calcium Oral TID WC  . escitalopram  20 mg Oral QHS  . heparin  5,000 Units Subcutaneous 3 times per day  . levofloxacin  750 mg Oral Q48H  . mometasone-formoterol  2 puff Inhalation BID  . montelukast  10 mg Oral QHS  . mupirocin ointment  1 application Nasal BID  . pantoprazole  40 mg Oral Daily  . topiramate  100 mg Oral Daily   Continuous Infusions:    Principal Problem:   CAP (community acquired pneumonia) Active Problems:   GERD (gastroesophageal reflux  disease)   Dyslipidemia   Migraine   Insomnia   Early Sepsis ([place])    Time spent:  83 minutes    Tahj Njoku, MD, FACP, FHM. [facility] Pager [phone]  If 7PM-7AM, please contact night-coverage [email or web address] Password [email or web address] 07/17/2015, 3:39 PM    LOS: 4 days

## 2015-07-17 NOTE — Care Management Note (Signed)
Case Management Note  Patient Details  Name: Melinda Berger MRN: [id number] Date of Birth: 07-30-1951  Subjective/Objective:           CM following for progression and d/c planning.         Action/Plan: No d/c needs identified, pt will d/c to home, independent.  Expected Discharge Date:  07/17/2015               Expected Discharge Plan:  Home/Self Care  In-House Referral:  NA  Discharge planning Services  NA  Post Acute Care Choice:  NA Choice offered to:  NA  DME Arranged:    DME Agency:     HH Arranged:    HH Agency:     Status of Service:  Completed, signed off  Medicare Important Message Given:    Date Medicare IM Given:    Medicare IM give by:    Date Additional Medicare IM Given:    Additional Medicare Important Message give by:     If discussed at Long Length of Stay Meetings, dates discussed:    Additional Comments:  Starlyn Skeans, RN 07/17/2015, 11:39 AM

## 2015-07-18 ENCOUNTER — Inpatient Hospital Stay (HOSPITAL_COMMUNITY): Payer: Managed Care, Other (non HMO)

## 2015-07-18 DIAGNOSIS — K449 Diaphragmatic hernia without obstruction or gangrene: Secondary | ICD-10-CM

## 2015-07-18 DIAGNOSIS — R0600 Dyspnea, unspecified: Secondary | ICD-10-CM

## 2015-07-18 LAB — BASIC METABOLIC PANEL
Anion gap: 10 (ref 5–15)
BUN: 7 mg/dL (ref 6–20)
CO2: 17 mmol/L — ABNORMAL LOW (ref 22–32)
CREATININE: 1.32 mg/dL — AB (ref 0.44–1.00)
Calcium: 7.4 mg/dL — ABNORMAL LOW (ref 8.9–10.3)
Chloride: 115 mmol/L — ABNORMAL HIGH (ref 101–111)
GFR calc Af Amer: 48 mL/min — ABNORMAL LOW (ref >60)
GFR, EST NON AFRICAN AMERICAN: 41 mL/min — AB (ref >60)
Glucose, Bld: 86 mg/dL (ref 65–99)
Potassium: 4.2 mmol/L (ref 3.5–5.1)
SODIUM: 142 mmol/L (ref 135–145)

## 2015-07-18 LAB — CULTURE, BLOOD (ROUTINE X 2)
CULTURE: NO GROWTH
Culture: NO GROWTH

## 2015-07-18 MED ORDER — LORAZEPAM 1 MG PO TABS
1.0000 mg | ORAL_TABLET | Freq: Once | ORAL | Status: AC
  Administered 2015-07-18: 1 mg via ORAL
  Filled 2015-07-18: qty 1

## 2015-07-18 MED ORDER — SODIUM BICARBONATE 8.4 % IV SOLN
INTRAVENOUS | Status: DC
  Administered 2015-07-18: 14:00:00 via INTRAVENOUS
  Filled 2015-07-18 (×2): qty 150

## 2015-07-18 MED ORDER — FUROSEMIDE 10 MG/ML IJ SOLN
40.0000 mg | Freq: Two times a day (BID) | INTRAMUSCULAR | Status: DC
  Administered 2015-07-18 (×2): 40 mg via INTRAVENOUS
  Filled 2015-07-18 (×2): qty 4

## 2015-07-18 NOTE — Progress Notes (Signed)
PROGRESS NOTE    Dorothie Wah HYI:[id number] DOB: 02-20-1951 DOA: 07/12/2015 PCP: No primary care provider on file.  HPI/Brief narrative  64 year old female with history of HTN, asthma, Barrett's esophagus, anxiety, GERD, chronic kidney disease , presented to [facility] ([place] ED on 07/13/15 with generalized fatigue, malaise , back pain, productive cough and dyspnea. In the ED, she was hypotensive and chest x-ray was suggestive of pneumonia. She was treated with IV levofloxacin for presumed community-acquired left lower lobe streptococcal pneumonia. She made slow gradual improvement for the first couple of days. On 10/7 she complained of feeling worse-worsening dyspnea and mid scapular pain. She also provided new history of right leg DVT in 2012-completed 6 months of Coumadin. Concern for PE and hence obtain d-dimer which was positive. CTA chest-no PE but moderate bilateral pleural effusion. Patient dyspneic on minimal exertion.   Assessment/Plan:  Community-acquired pneumonia (pneumococcal ) -left lower lobe -  Apparently failed outpatient oral Keflex treatment -  Empirically treated with IV levofloxacin -  Met sepsis criteria on admission : hypotension , tachycardia and chest x-ray consistent with pneumonia -  Will need follow-up chest x-ray in 3-4 weeks to ensure resolution of pneumonia findings -  Urine strep pneumococcal antigen positive. Urine Legionella antigen: Negative -  Blood cultures 2: Negative - Complete course of levofloxacin. - CTA chest 10/7: No PE or aortic dissection. Moderate bilateral pleural effusions.  Bilateral pleural effusion - Etiology unclear but may be transudative? Secondary to hypoalbuminemia versus possible CHF - Discussed with pulmonology M.D. on call on 10/8: Recommended IV Lasix, no thoracentesis indicated at this time and follow repeat chest x-ray in a.m. - We will also get 2-D echo to check LV function.   Sepsis, present on admission-secondary to pneumococcal  pneumonia -  Secondary to community-acquired pneumonia. -  Sepsis physiology resolved.   Acute on stage III chronic kidney disease -  Creatinine on admission 1.7. Baseline creatinine: 1.2-1.4. Creatinine stabilized at 1.2 range.  Hypokalemia -  Replaced   Non-anion gap metabolic acidosis -  Bicarbonate has progressively decreased from 20 on admission to 123 on 10/5. Unclear etiology -? Secondary to hyperchloremia.  -  Lactate normal on 10/3 - Improved after bicarbonate drip but bicarbonate has again dropped to 17. As discussed with pulmonology on 10/8, will also add bicarbonate drip and follow BMP in a.m.   Hypocalcemia/vitamin D deficiency -  Corrected serum calcium of 7.6. Started Os-Cal with D. Asymptomatic. Outpatient follow-up with vitamin D levels (last time a couple of months ago was in the teens)   Chronic anemia -  Stable   Barrett's esophagus/GERD/large sliding hiatus hernia by CT chest -  PPI   Dyslipidemia -  Continue home medications  Asthma -  Stable  History of right leg DVT in 2012 - Completed Coumadin anticoagulation    DVT prophylaxis: Subcutaneous heparin Code Status:  full Family Communication:  Discussed with patient's daughter Ms. Windell Hummingbird who is a Librarian, academic for an internal medicine practice. Updated care and answered questions. Disposition Plan:  DC home when medically stable.   Consultants:   None  Procedures:   None  Antibiotics:   IV levofloxacin 07/12/15 >   Subjective: States that she feels better than yesterday but clearly dyspneic on mild exertion. Denies chest or back pain.  Objective: Filed Vitals:   07/17/15 2129 07/18/15 0557 07/18/15 0837 07/18/15 0951  BP: 127/67 123/96  133/61  Pulse: 76 92  78  Temp: 99.3 F (37.4 C) 98.9 F (37.2 C)  99 F (37.2 C)  TempSrc: Oral Oral  Oral  Resp: '18 15  18  ' Height: 5' (1.524 m)     Weight: 97.27 kg (214 lb 7.1 oz)     SpO2: 100% 94% 93% 98%    Intake/Output  Summary (Last 24 hours) at 07/18/15 1144 Last data filed at 07/18/15 1028  Gross per 24 hour  Intake    500 ml  Output    475 ml  Net     25 ml   Filed Weights   07/14/15 0500 07/14/15 2022 07/17/15 2129  Weight: 94.1 kg (207 lb 7.3 oz) 96.662 kg (213 lb 1.6 oz) 97.27 kg (214 lb 7.1 oz)     Exam:  General exam:  Pleasant middle-aged female seen after she had ambulated from the bed to the bathroom and appeared mildly dyspneic. Respiratory system: Reduced breath sounds in the bases. Rest of lung fields clear to auscultation. No increased work of breathing. Cardiovascular system: S1 & S2 heard, RRR. No JVD, murmurs, gallops, clicks. Trace bilateral leg edema. Gastrointestinal system: Abdomen is nondistended, soft and nontender. Normal bowel sounds heard. Central nervous system: Alert and oriented. No focal neurological deficits. Extremities: Symmetric 5 x 5 power.   Data Reviewed: Basic Metabolic Panel:  Recent Labs Lab 07/15/15 0745 07/15/15 1700 07/16/15 0610 07/17/15 0525 07/18/15 0530  NA 140 140 139 138 142  K 4.1 4.6 4.3 3.8 4.2  CL 120* 120* 114* 111 115*  CO2 13* 12* 18* 19* 17*  GLUCOSE 87 96 91 86 86  BUN '9 9 8 7 7  ' CREATININE 1.22* 1.22* 1.25* 1.26* 1.32*  CALCIUM 6.4* 6.5* 6.8* 6.9* 7.4*   Liver Function Tests:  Recent Labs Lab 07/12/15 2200 07/13/15 0605  AST 20 19  ALT 10* 10*  ALKPHOS 92 81  BILITOT 0.4 0.3  PROT 6.2* 5.0*  ALBUMIN 3.2* 2.5*    Recent Labs Lab 07/12/15 2200  LIPASE 28   No results for input(s): AMMONIA in the last 168 hours. CBC:  Recent Labs Lab 07/12/15 2200 07/13/15 0605 07/15/15 0745  WBC 12.5* 8.7 7.5  NEUTROABS 8.3* 5.0  --   HGB 11.0* 9.7* 9.6*  HCT 34.5* 31.0* 29.4*  MCV 93.8 93.1 92.5  PLT 181 167 169   Cardiac Enzymes: No results for input(s): CKTOTAL, CKMB, CKMBINDEX, TROPONINI in the last 168 hours. BNP (last 3 results) No results for input(s): PROBNP in the last 8760 hours. CBG:  Recent  Labs Lab 07/13/15 1642  GLUCAP 92    Recent Results (from the past 240 hour(s))  Urine culture     Status: None   Collection Time: 07/12/15 11:25 PM  Result Value Ref Range Status   Specimen Description URINE, CLEAN CATCH  Final   Special Requests NONE  Final   Culture   Final    MULTIPLE SPECIES PRESENT, SUGGEST RECOLLECTION Performed at [facility]    Report Status 07/14/2015 FINAL  Final  Blood Culture (routine x 2)     Status: None   Collection Time: 07/13/15 12:20 AM  Result Value Ref Range Status   Specimen Description BLOOD LEFT ANTECUBITAL  Final   Special Requests BOTTLES DRAWN AEROBIC AND ANAEROBIC 10CC  Final   Culture   Final    NO GROWTH 5 DAYS Performed at [facility]    Report Status 07/18/2015 FINAL  Final  Blood Culture (routine x 2)     Status: None   Collection Time: 07/13/15 12:25 AM  Result Value Ref Range Status   Specimen Description BLOOD RIGHT ANTECUBITAL  Final   Special Requests BOTTLES DRAWN AEROBIC AND ANAEROBIC 10CC  Final   Culture   Final    NO GROWTH 5 DAYS Performed at [facility]    Report Status 07/18/2015 FINAL  Final  MRSA PCR Screening     Status: Abnormal   Collection Time: 07/13/15  2:24 AM  Result Value Ref Range Status   MRSA by PCR POSITIVE (A) NEGATIVE Final    Comment:        The GeneXpert MRSA Assay (FDA approved for NASAL specimens only), is one component of a comprehensive MRSA colonization surveillance program. It is not intended to diagnose MRSA infection nor to guide or monitor treatment for MRSA infections. CRITICAL RESULT CALLED TO, READ BACK BY AND VERIFIED WITH: BUFFY.S RN @ 06:56 10/ CRITICAL RESULT CALLED TO, READ BACK BY AND VERIFIED WITH: BUFFY.S RN @ 6:56 07/13/2015 BY K.PEELE            Studies: Ct Angio Chest Pe W/cm &/or Wo Cm  07/17/2015   CLINICAL DATA:  Shortness of breath.  EXAM: CT ANGIOGRAPHY CHEST WITH CONTRAST  TECHNIQUE: Multidetector CT imaging of the  chest was performed using the standard protocol during bolus administration of intravenous contrast. Multiplanar CT image reconstructions and MIPs were obtained to evaluate the vascular anatomy.  CONTRAST:  151m OMNIPAQUE IOHEXOL 350 MG/ML SOLN  COMPARISON:  Chest radiograph of July 12, 2015.  FINDINGS: No pneumothorax is noted. Moderate bilateral pleural effusions are noted with adjacent subsegmental atelectasis. There is no evidence of pulmonary embolus. Large sliding-type hiatal hernia is noted. Visualized portion of upper abdomen is unremarkable. No mediastinal mass or adenopathy is noted. 1 cm low density is noted in left thyroid lobe. There is no evidence of thoracic aortic dissection or aneurysm. Mild coronary artery calcifications are noted. No significant osseous abnormality is noted in the chest.  Review of the MIP images confirms the above findings.  IMPRESSION: No evidence of pulmonary embolus.  Moderate bilateral pleural effusions are noted.  Large sliding-type hiatal hernia is noted.   Electronically Signed   By: JMarijo Conception M.D.   On: 07/17/2015 17:26        Scheduled Meds: . calcium-vitamin D  2 tablet Oral TID  . escitalopram  20 mg Oral QHS  . furosemide  40 mg Intravenous BID  . heparin  5,000 Units Subcutaneous 3 times per day  . levofloxacin  750 mg Oral Q48H  . mometasone-formoterol  2 puff Inhalation BID  . montelukast  10 mg Oral QHS  . pantoprazole  40 mg Oral Daily  . topiramate  100 mg Oral Daily   Continuous Infusions: .  sodium bicarbonate  infusion 1000 mL      Principal Problem:   CAP (community acquired pneumonia) Active Problems:   GERD (gastroesophageal reflux disease)   Dyslipidemia   Migraine   Insomnia   [place] Sepsis ([place]    Time spent:  380minutes    Shadavia Dampier, MD, FACP, FHM. [facility] Pager [phone] If 7PM-7AM, please contact night-coverage [email or web address] Password [email or web address] 07/18/2015, 11:44 AM    LOS: 5 days

## 2015-07-18 NOTE — Progress Notes (Signed)
  Echocardiogram 2D Echocardiogram has been performed.  Melinda Berger 07/18/2015, 1:23 PM

## 2015-07-18 NOTE — Plan of Care (Signed)
Problem: Phase I Progression Outcomes Goal: EF % per last Echo/documented,Core Reminder form on chart Outcome: Completed/Met Date Met:  07/18/15 65-70% EF.

## 2015-07-19 ENCOUNTER — Inpatient Hospital Stay (HOSPITAL_COMMUNITY): Payer: Managed Care, Other (non HMO)

## 2015-07-19 DIAGNOSIS — J90 Pleural effusion, not elsewhere classified: Secondary | ICD-10-CM

## 2015-07-19 DIAGNOSIS — K210 Gastro-esophageal reflux disease with esophagitis: Secondary | ICD-10-CM

## 2015-07-19 DIAGNOSIS — I272 Other secondary pulmonary hypertension: Secondary | ICD-10-CM

## 2015-07-19 DIAGNOSIS — J189 Pneumonia, unspecified organism: Secondary | ICD-10-CM

## 2015-07-19 DIAGNOSIS — E785 Hyperlipidemia, unspecified: Secondary | ICD-10-CM

## 2015-07-19 LAB — BASIC METABOLIC PANEL
ANION GAP: 12 (ref 5–15)
BUN: 11 mg/dL (ref 6–20)
CALCIUM: 8 mg/dL — AB (ref 8.9–10.3)
CHLORIDE: 109 mmol/L (ref 101–111)
CO2: 19 mmol/L — AB (ref 22–32)
CREATININE: 1.69 mg/dL — AB (ref 0.44–1.00)
GFR calc Af Amer: 36 mL/min — ABNORMAL LOW (ref >60)
GFR calc non Af Amer: 31 mL/min — ABNORMAL LOW (ref >60)
GLUCOSE: 78 mg/dL (ref 65–99)
Potassium: 4.9 mmol/L (ref 3.5–5.1)
Sodium: 140 mmol/L (ref 135–145)

## 2015-07-19 LAB — PROTIME-INR
INR: 1.22 (ref 0.00–1.49)
Prothrombin Time: 15.6 seconds — ABNORMAL HIGH (ref 11.6–15.2)

## 2015-07-19 LAB — BRAIN NATRIURETIC PEPTIDE: B NATRIURETIC PEPTIDE 5: 273.3 pg/mL — AB (ref 0.0–100.0)

## 2015-07-19 MED ORDER — SODIUM BICARBONATE 650 MG PO TABS
650.0000 mg | ORAL_TABLET | Freq: Three times a day (TID) | ORAL | Status: DC
  Administered 2015-07-19 – 2015-07-20 (×3): 650 mg via ORAL
  Filled 2015-07-19 (×3): qty 1

## 2015-07-19 NOTE — Progress Notes (Signed)
PROGRESS NOTE    Younique Casad VZD:[id number] DOB: [birth year] DOA: 07/12/2015 PCP: No primary care provider on file. Dr. Harrell Lark Pulmonology: Dr. Claris Gladden  HPI/Brief narrative  64 year old female with history of HTN, asthma, Barrett's esophagus, anxiety, GERD, chronic kidney disease , presented to [facility] ED on 07/13/15 with generalized fatigue, malaise , back pain, productive cough and dyspnea. In the ED, she was hypotensive and chest x-ray was suggestive of pneumonia. She was treated with IV levofloxacin for presumed community-acquired left lower lobe streptococcal pneumonia. She made slow gradual improvement for the first couple of days. On 10/7 she complained of feeling worse-worsening dyspnea and mid scapular pain. She also provided new history of right leg DVT in 2012-completed 6 months of Coumadin. Concern for PE and hence obtain d-dimer which was positive. CTA chest-no PE but moderate bilateral pleural effusion. Patient dyspneic on minimal exertion. Trial of IV Lasix 40 mg every 12 and bicarbonate drip on 10/8. Creatinine bumped up. Dyspnea better. Pulmonology consulted 10/9   Assessment/Plan:  Community-acquired pneumonia (pneumococcal ) -left lower lobe -  Apparently failed outpatient oral Keflex treatment -  Empirically treated with IV levofloxacin -  Met sepsis criteria on admission : hypotension , tachycardia and chest x-ray consistent with pneumonia -  Will need follow-up chest x-ray in 3-4 weeks to ensure resolution of pneumonia findings -  Urine strep pneumococcal antigen positive. Urine Legionella antigen: Negative -  Blood cultures 2: Negative - Complete course of levofloxacin. - CTA chest 10/7: No PE or aortic dissection. Moderate bilateral pleural effusions.  Bilateral pleural effusion - Etiology unclear but may be transudative? Secondary to hypoalbuminemia versus possible CHF - Discussed with pulmonology M.D. on call on 10/8: Recommended IV Lasix, no  thoracentesis indicated at this time and follow repeat chest x-ray in a.m. - 2-D echo: Results as below shows grade 2 diastolic dysfunction and moderate to severe pulmonary hypertension - Diuresed with IV Lasix although intake output was not charted. Dyspnea better. Further diuresis limited secondary to worsening creatinine. - Pulmonology consulted 10/9: May need diagnostic/therapeutic thoracentesis. Requested INR. Hold Lasix.  Moderate to severe pulmonary hypertension - Possibly from chronic underlying lung disease-? Etiology. Gives a 10 year history of asthma. Remote history of mild smoking. Follows with Dr. Claris Gladden, Pulmonology.   Sepsis, present on admission-secondary to pneumococcal pneumonia -  Secondary to community-acquired pneumonia. -  Sepsis physiology resolved.   Acute on stage III chronic kidney disease -  Creatinine on admission 1.7. Baseline creatinine: 1.2-1.4. Creatinine has worsened from 1.3-1.6 post IV Lasix. Encourage oral liquids. Hold Lasix. Follow BMP in a.m.  Hypokalemia -  Replaced   Non-anion gap metabolic acidosis -  Bicarbonate has progressively decreased from 20 on admission to 123 on 10/5. Unclear etiology -? Secondary to hyperchloremia.  -  Lactate normal on 10/3 - Persistent non-anion gap metabolic acidosis of unclear etiology. Bicarbonate again improved after bicarbonate drip to 19. DC bicarbonate drip and placed on sodium bicarbonate tablets 650 three times a day.   Hypocalcemia/vitamin D deficiency -  Corrected serum calcium of 7.6. Started Os-Cal with D. Asymptomatic. Outpatient follow-up with vitamin D levels (last time a couple of months ago was in the teens)   Chronic anemia -  Stable   Barrett's esophagus/GERD/large sliding hiatus hernia by CT chest -  PPI   Dyslipidemia -  Continue home medications  Asthma -  Stable  History of right leg DVT in 2012 - Completed Coumadin anticoagulation    DVT prophylaxis: Subcutaneous  heparin Code Status:  full Family Communication:  Discussed with patient's daughter Ms. Windell Hummingbird on 10/9 who is a Librarian, academic for an internal medicine practice. Updated care and answered questions. Disposition Plan:  DC home when medically stable-not medically stable for discharge.   Consultants:   Pulmonology  Procedures:   2-D echo 07/18/15: Study Conclusions  - Left ventricle: The cavity size was normal. Systolic function was vigorous. The estimated ejection fraction was in the range of 65% to 70%. Wall motion was normal; there were no regional wall motion abnormalities. Features are consistent with a pseudonormal left ventricular filling pattern, with concomitant abnormal relaxation and increased filling pressure (grade 2 diastolic dysfunction). Doppler parameters are consistent with mildly elevated ventricular end-diastolic filling pressure. - Aortic valve: There was no regurgitation. - Aortic root: The aortic root was normal in size. - Mitral valve: There was moderate regurgitation. - Left atrium: The atrium was mildly dilated. - Right ventricle: The cavity size was mildly dilated. Wall thickness was normal. - Right atrium: The atrium was moderately dilated. - Tricuspid valve: There was moderate regurgitation. - Pulmonic valve: There was no regurgitation. - Pulmonary arteries: Systolic pressure was moderately to severely increased. PA peak pressure: 53 mm Hg (S). - Inferior vena cava: The vessel was dilated. The respirophasic diameter changes were blunted (< 50%), consistent with elevated central venous pressure. - Pericardium, extracardiac: There was no pericardial effusion.  Impressions:  - Hyperdynamic LVEF with mildly elevated filling pressures. Dilaed RV and RA with moderate TR and moderate to severe pulmonary hypertension suggestive of underlying lung problem.  Antibiotics:   IV levofloxacin 07/12/15 >   Subjective: "I  peed like a horse". Dyspnea slightly better. Did not drink much fluids yesterday.  Objective: Filed Vitals:   07/18/15 2058 07/18/15 2103 07/19/15 0610 07/19/15 1008  BP: 158/64  134/51 142/50  Pulse: 59  60 63  Temp: 99.1 F (37.3 C)  98.4 F (36.9 C) 98.1 F (36.7 C)  TempSrc: Oral  Oral Oral  Resp: _0 Height: 5' (1.524 m)     Weight:      SpO2: 100% 100% 94% 98%    Intake/Output Summary (Last 24 hours) at 07/19/15 1213 Last data filed at 07/19/15 1008  Gross per 24 hour  Intake 1553.34 ml  Output   1825 ml  Net -271.66 ml   Filed Weights   07/14/15 0500 07/14/15 2022 07/17/15 2129  Weight: 94.1 kg (207 lb 7.3 oz) 96.662 kg (213 lb 1.6 oz) 97.27 kg (214 lb 7.1 oz)     Exam:  General exam:  Pleasant middle-aged female sitting up comfortably in chair  Respiratory system: Reduced breath sounds in the bases. Rest of lung fields clear to auscultation. No increased work of breathing. Cardiovascular system: S1 & S2 heard, RRR. No JVD, murmurs, gallops, clicks. Trace bilateral leg edema. Gastrointestinal system: Abdomen is nondistended, soft and nontender. Normal bowel sounds heard. Central nervous system: Alert and oriented. No focal neurological deficits. Extremities: Symmetric 5 x 5 power.   Data Reviewed: Basic Metabolic Panel:  Recent Labs Lab 07/15/15 1700 07/16/15 0610 07/17/15 0525 07/18/15 0530 07/19/15 0708  NA 140 139 138 142 140  K 4.6 4.3 3.8 4.2 4.9  CL 120* 114* 111 115* 109  CO2 12* 18* 19* 17* 19*  GLUCOSE 96 91 86 86 78  BUN _1 CREATININE 1.22* 1.25* 1.26* 1.32* 1.69*  CALCIUM 6.5* 6.8* 6.9* 7.4* 8.0*   Liver  Function Tests:  Recent Labs Lab 07/12/15 2200 07/13/15 0605  AST 20 19  ALT 10* 10*  ALKPHOS 92 81  BILITOT 0.4 0.3  PROT 6.2* 5.0*  ALBUMIN 3.2* 2.5*    Recent Labs Lab 07/12/15 2200  LIPASE 28   No results for input(s): AMMONIA in the last 168 hours. CBC:  Recent Labs Lab 07/12/15 2200  07/13/15 0605 07/15/15 0745  WBC 12.5* 8.7 7.5  NEUTROABS 8.3* 5.0  --   HGB 11.0* 9.7* 9.6*  HCT 34.5* 31.0* 29.4*  MCV 93.8 93.1 92.5  PLT 181 167 169   Cardiac Enzymes: No results for input(s): CKTOTAL, CKMB, CKMBINDEX, TROPONINI in the last 168 hours. BNP (last 3 results) No results for input(s): PROBNP in the last 8760 hours. CBG:  Recent Labs Lab 07/13/15 1642  GLUCAP 92    Recent Results (from the past 240 hour(s))  Urine culture     Status: None   Collection Time: 07/12/15 11:25 PM  Result Value Ref Range Status   Specimen Description URINE, CLEAN CATCH  Final   Special Requests NONE  Final   Culture   Final    MULTIPLE SPECIES PRESENT, SUGGEST RECOLLECTION Performed at [facility]    Report Status 07/14/2015 FINAL  Final  Blood Culture (routine x 2)     Status: None   Collection Time: 07/13/15 12:20 AM  Result Value Ref Range Status   Specimen Description BLOOD LEFT ANTECUBITAL  Final   Special Requests BOTTLES DRAWN AEROBIC AND ANAEROBIC 10CC  Final   Culture   Final    NO GROWTH 5 DAYS Performed at [facility]    Report Status 07/18/2015 FINAL  Final  Blood Culture (routine x 2)     Status: None   Collection Time: 07/13/15 12:25 AM  Result Value Ref Range Status   Specimen Description BLOOD RIGHT ANTECUBITAL  Final   Special Requests BOTTLES DRAWN AEROBIC AND ANAEROBIC 10CC  Final   Culture   Final    NO GROWTH 5 DAYS Performed at [facility]    Report Status 07/18/2015 FINAL  Final  MRSA PCR Screening     Status: Abnormal   Collection Time: 07/13/15  2:24 AM  Result Value Ref Range Status   MRSA by PCR POSITIVE (A) NEGATIVE Final    Comment:        The GeneXpert MRSA Assay (FDA approved for NASAL specimens only), is one component of a comprehensive MRSA colonization surveillance program. It is not intended to diagnose MRSA infection nor to guide or monitor treatment for MRSA infections. CRITICAL RESULT CALLED TO,  READ BACK BY AND VERIFIED WITH: BUFFY.S RN @ 06:56 10/ CRITICAL RESULT CALLED TO, READ BACK BY AND VERIFIED WITH: BUFFY.S RN @ 6:56 07/13/2015 BY K.PEELE            Studies: Dg Chest 2 View  07/19/2015   CLINICAL DATA:  Admitted to the hospital 1 week ago with pneumonia and subsequent bilateral pleural effusions. Follow-up.  EXAM: CHEST  2 VIEW  COMPARISON:  CT 07/17/2015.  Radiography 07/12/2015.  FINDINGS: The heart is mildly enlarged. There is a large hiatal hernia. There are bilateral pleural effusions with mild basilar atelectasis. Upper lungs are clear.  IMPRESSION: Bilateral effusions with basilar atelectasis.   Electronically Signed   By: Nelson Chimes M.D.   On: 07/19/2015 08:46   Ct Angio Chest Pe W/cm &/or Wo Cm  07/17/2015   CLINICAL DATA:  Shortness of breath.  EXAM: CT ANGIOGRAPHY CHEST WITH CONTRAST  TECHNIQUE: Multidetector CT imaging of the chest was performed using the standard protocol during bolus administration of intravenous contrast. Multiplanar CT image reconstructions and MIPs were obtained to evaluate the vascular anatomy.  CONTRAST:  172m OMNIPAQUE IOHEXOL 350 MG/ML SOLN  COMPARISON:  Chest radiograph of July 12, 2015.  FINDINGS: No pneumothorax is noted. Moderate bilateral pleural effusions are noted with adjacent subsegmental atelectasis. There is no evidence of pulmonary embolus. Large sliding-type hiatal hernia is noted. Visualized portion of upper abdomen is unremarkable. No mediastinal mass or adenopathy is noted. 1 cm low density is noted in left thyroid lobe. There is no evidence of thoracic aortic dissection or aneurysm. Mild coronary artery calcifications are noted. No significant osseous abnormality is noted in the chest.  Review of the MIP images confirms the above findings.  IMPRESSION: No evidence of pulmonary embolus.  Moderate bilateral pleural effusions are noted.  Large sliding-type hiatal hernia is noted.   Electronically Signed   By: JMarijo Conception  M.D.   On: 07/17/2015 17:26        Scheduled Meds: . calcium-vitamin D  2 tablet Oral TID  . escitalopram  20 mg Oral QHS  . heparin  5,000 Units Subcutaneous 3 times per day  . mometasone-formoterol  2 puff Inhalation BID  . montelukast  10 mg Oral QHS  . pantoprazole  40 mg Oral Daily  . sodium bicarbonate  650 mg Oral TID  . topiramate  100 mg Oral Daily   Continuous Infusions:    Principal Problem:   CAP (community acquired pneumonia) Active Problems:   GERD (gastroesophageal reflux disease)   Dyslipidemia   Migraine   Insomnia   Early Sepsis ([place]    Time spent:  221minutes    HONGALGI,ANAND, MD, FACP, FHM. [facility] Pager [phone] If 7PM-7AM, please contact night-coverage [email or web address] Password [email or web address] 07/19/2015, 12:13 PM    LOS: 6 days

## 2015-07-19 NOTE — Consult Note (Addendum)
Name: Melinda Berger MRN: [id number] DOB: 02-06-1951    ADMISSION DATE:  07/12/2015 CONSULTATION DATE:  10/9  REFERRING MD :  Hongalgi   CHIEF COMPLAINT:  Dyspnea and pleural effusions   BRIEF PATIENT DESCRIPTION:  64 year old female w/ h/o asthma, CKD, and HTN admitted on 10/3 w/ what was eventually diagnosed as pneumococcal PNA based on + strep antigen. Treated w/ approp abx & initially improved; then on 10/7 felt worse. CT chest neg for PE but had bilateral effusions L>R. Initially treated w/ diuretics but creatinine bumped. PCCM asked to eval for dyspnea and effusions.    SIGNIFICANT EVENTS    STUDIES:  CT chest 10/8: No evidence of pulmonary embolus.Moderate bilateral pleural effusions are noted.Large sliding-type hiatal hernia is noted. ECHO 10/8: EF 65-70%; no wall motion abnormality, Features are consistent with a pseudonormal left ventricular filling pattern, with concomitant abnormalrelaxation and increased filling pressure (grade 2 diastolic dysfunction). Doppler parameters are consistent with mildlyelevated ventricular end-diastolic filling pressure. PA peak pressure: 53 mmHg. Right ventricle: The cavity size was mildly dilated.   HISTORY OF PRESENT ILLNESS:    64 year old female with history of HTN, asthma, Barrett's esophagus, anxiety, GERD, chronic kidney disease , presented to [facility] ED on 07/13/15 with generalized fatigue, malaise , back pain, productive cough and dyspnea. In the ED, she was hypotensive and chest x-ray was suggestive of pneumonia. She was treated with IV levofloxacin for presumed community-acquired left lower lobe her streptococcal pneumonia urinary antigen was positive. She made slow gradual improvement for the first couple of days. On 10/7 she complained of feeling worse-worsening dyspnea and mid scapular pain. She also provided new history of right leg DVT in 2012-completed 6 months of Coumadin. Concern for PE and hence obtain d-dimer which was positive.  CTA chest-no PE but moderate bilateral pleural effusion. Patient dyspneic on minimal exertion. Trial of IV Lasix 40 mg every 12 and bicarbonate drip on 10/8. Dyspnea was better but Creatinine bumped up. Pulmonology consulted 10/9 for effusions.   PAST MEDICAL HISTORY :   has a past medical history of Hypertension; Asthma; Barrett esophagus; Thyroid disease; Migraines; Heart murmur; Depression; Anxiety; Chronic kidney disease; GERD (gastroesophageal reflux disease); History of hiatal hernia; and Arthritis.  has past surgical history that includes Tonsillectomy; ORIF ulnar fracture (10/07/2011); I&D extremity (10/07/2011); Rotator cuff repair; Replacement total knee (Left); Eye surgery; and Joint replacement. Prior to Admission medications   Medication Sig Start Date End Date Taking? Authorizing Provider  albuterol (PROVENTIL) (2.5 MG/3ML) 0.083% nebulizer solution Inhale 3 mLs into the lungs daily as needed. For shortness of breath 10/18/10  Yes Historical Provider, MD  betamethasone dipropionate (DIPROLENE) 0.05 % cream Apply 1 application topically daily as needed. For leg irritation 05/30/15  Yes Historical Provider, MD  butalbital-acetaminophen-caffeine (FIORICET, ESGIC) 50-325-40 MG tablet Take 1 tablet by mouth 2 (two) times daily as needed for headache.   Yes Historical Provider, MD  CARAFATE 1 GM/10ML suspension Take 10 mLs by mouth 4 (four) times daily as needed (for heartburn).  06/30/15  Yes Historical Provider, MD  clopidogrel (PLAVIX) 75 MG tablet Take 75 mg by mouth every Monday, Wednesday, and Friday.   Yes Historical Provider, MD  cyclobenzaprine (FLEXERIL) 10 MG tablet Take 10 mg by mouth 3 (three) times daily as needed for muscle spasms.   Yes Historical Provider, MD  escitalopram (LEXAPRO) 20 MG tablet Take 20 mg by mouth at bedtime.     Yes Historical Provider, MD  FLECTOR 1.3 % PTCH Place 1  patch onto the skin daily as needed (for back pain).  06/08/15  Yes Historical Provider, MD    fluticasone (FLONASE) 50 MCG/ACT nasal spray Place 2 sprays into both nostrils daily.   Yes Historical Provider, MD  Fluticasone-Salmeterol (ADVAIR) 250-50 MCG/DOSE AEPB Inhale 1 puff into the lungs 2 (two) times daily.   Yes Historical Provider, MD  gabapentin (NEURONTIN) 100 MG capsule Take 100 mg by mouth 4 (four) times daily. 06/24/15  Yes Historical Provider, MD  LORazepam (ATIVAN) 0.5 MG tablet Take 0.5 mg by mouth every 6 (six) hours as needed. For anxiety    Yes Historical Provider, MD  losartan-hydrochlorothiazide (HYZAAR) 100-12.5 MG tablet Take 1 tablet by mouth daily as needed (for hypertension, only as needed).  06/16/15  Yes Historical Provider, MD  montelukast (SINGULAIR) 10 MG tablet Take 10 mg by mouth at bedtime.   Yes Historical Provider, MD  nitrofurantoin (MACRODANTIN) 50 MG capsule Take 50 mg by mouth daily.   Yes Historical Provider, MD  nystatin (MYCOSTATIN/NYSTOP) 100000 UNIT/GM POWD Apply 1 g topically daily as needed (for fungal irritation).   Yes Historical Provider, MD  oxyCODONE (OXY IR/ROXICODONE) 5 MG immediate release tablet Take 10 mg by mouth every 4 (four) hours as needed for severe pain.    Yes Historical Provider, MD  PROLIA 60 MG/ML SOLN injection Inject 60 mLs into the muscle See admin instructions. Takes injections twice a year 04/28/15  Yes Historical Provider, MD  RABEprazole (ACIPHEX) 20 MG tablet Take 20 mg by mouth daily.    Yes Historical Provider, MD  temazepam (RESTORIL) 30 MG capsule Take 30 mg by mouth at bedtime as needed for sleep.   Yes Historical Provider, MD  topiramate (TOPAMAX) 100 MG tablet Take 100 mg by mouth at bedtime.    Yes Historical Provider, MD  cephALEXin (KEFLEX) 500 MG capsule Take 500 mg by mouth 2 (two) times daily. 07/02/15   Historical Provider, MD   Allergies  Allergen Reactions  . Amoxicillin-Pot Clavulanate Diarrhea    Very severe diarrhea  . Nsaids Other (See Comments)    Due to barret esophagus  . Sulfa Antibiotics  Hives, Itching and Rash  . Zolpidem Tartrate Other (See Comments)    Hallucinations     FAMILY HISTORY:  family history is not on file. SOCIAL HISTORY:  reports that she has never smoked. She has never used smokeless tobacco. She reports that she drinks alcohol. She reports that she does not use illicit drugs.  REVIEW OF SYSTEMS:   Constitutional: Negative for fever, chills, weight loss, malaise/fatigue and diaphoresis.  HENT: Negative for hearing loss, ear pain, nosebleeds, congestion, sore throat, neck pain, tinnitus and ear discharge.   Eyes: Negative for blurred vision, double vision, photophobia, pain, discharge and redness.  Respiratory: Negative for cough, hemoptysis, sputum production, shortness of breath, wheezing and stridor.   Cardiovascular: Negative for chest pain, palpitations, orthopnea, claudication, leg swelling and PND.  Gastrointestinal: Negative for heartburn, nausea, vomiting, abdominal pain, diarrhea, constipation, blood in stool and melena.  Genitourinary: Negative for dysuria, urgency, frequency, hematuria and flank pain.  Musculoskeletal: Negative for myalgias, back pain, joint pain and falls.  Skin: Negative for itching and rash.  Neurological: Negative for dizziness, tingling, tremors, sensory change, speech change, focal weakness, seizures, loss of consciousness, weakness and headaches.  Endo/Heme/Allergies: Negative for environmental allergies and polydipsia. Does not bruise/bleed easily.  SUBJECTIVE:  Feels better  VITAL SIGNS: Temp:  [98.1 F (36.7 C)-99.1 F (37.3 C)] 98.1 F (36.7 C) (10/09  1008) Pulse Rate:  [59-64] 63 (10/09 1008) Resp:  [17-18] 17 (10/09 1008) BP: (134-158)/(50-71) 142/50 mmHg (10/09 1008) SpO2:  [94 %-100 %] 98 % (10/09 1008)  PHYSICAL EXAMINATION: General:  Chronically ill appearing female currently comfortable  Neuro:  Awake, alert no focal def  HEENT:  NCAT  Cardiovascular:  rrr Lungs:  Good breath sounds occ wheeze    Abdomen:  Soft, non-tender  Musculoskeletal:  + bowel sounds  Skin:  Intact    Recent Labs Lab 07/17/15 0525 07/18/15 0530 07/19/15 0708  NA 138 142 140  K 3.8 4.2 4.9  CL 111 115* 109  CO2 19* 17* 19*  BUN CREATININE 1.26* 1.32* 1.69*  GLUCOSE 86 86 78    Recent Labs Lab 07/12/15 2200 07/13/15 0605 07/15/15 0745  HGB 11.0* 9.7* 9.6*  HCT 34.5* 31.0* 29.4*  WBC 12.5* 8.7 7.5  PLT 181 167 169   Dg Chest 2 View  07/19/2015   CLINICAL DATA:  Admitted to the hospital 1 week ago with pneumonia and subsequent bilateral pleural effusions. Follow-up.  EXAM: CHEST  2 VIEW  COMPARISON:  CT 07/17/2015.  Radiography 07/12/2015.  FINDINGS: The heart is mildly enlarged. There is a large hiatal hernia. There are bilateral pleural effusions with mild basilar atelectasis. Upper lungs are clear.  IMPRESSION: Bilateral effusions with basilar atelectasis.   Electronically Signed   By: Paulina Fusi M.D.   On: 07/19/2015 08:46   Ct Angio Chest Pe W/cm &/or Wo Cm  07/17/2015   CLINICAL DATA:  Shortness of breath.  EXAM: CT ANGIOGRAPHY CHEST WITH CONTRAST  TECHNIQUE: Multidetector CT imaging of the chest was performed using the standard protocol during bolus administration of intravenous contrast. Multiplanar CT image reconstructions and MIPs were obtained to evaluate the vascular anatomy.  CONTRAST:  OMNIPAQUE IOHEXOL 350 MG/ML SOLN  COMPARISON:  Chest radiograph of July 12, 2015.  FINDINGS: No pneumothorax is noted. Moderate bilateral pleural effusions are noted with adjacent subsegmental atelectasis. There is no evidence of pulmonary embolus. Large sliding-type hiatal hernia is noted. Visualized portion of upper abdomen is unremarkable. No mediastinal mass or adenopathy is noted. 1 cm low density is noted in left thyroid lobe. There is no evidence of thoracic aortic dissection or aneurysm. Mild coronary artery calcifications are noted. No significant osseous abnormality is noted in the  chest.  Review of the MIP images confirms the above findings.  IMPRESSION: No evidence of pulmonary embolus.  Moderate bilateral pleural effusions are noted.  Large sliding-type hiatal hernia is noted.   Electronically Signed   By: Lupita Raider, M.D.   On: 07/17/2015 17:26    ASSESSMENT / PLAN: Dyspnea in setting of Recent Pneumococcal PNA, Bilateral effusions and metabolic acidosis. Suspect that the acidosis was more of a contributing factor to her dyspnea then the effusions. F/U US at bedside reveals very small effusions bilateral. Given the fact that she feels better do not think sampling these today will a/w her dyspnea.  Plan Complete abx course Wean O2 F/u CXR (PA/Lat) tomorrow.  At this point do not think role for thora. Will look again w/ [place] tomorrow as well but currently does not look big enough to safely sample.   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   CKD stage III Scr increased w/ lasix  Plan Keep even at this point   NAG metabolic acidosis. Likely in the setting of hyperchloremia after volume resuscitations  Plan Bicarb oral  replacement   Melinda Berger ACNP-BC [facility] Pager # [phone] OR # [phone] if no answer   07/19/2015, 4:33 PM  STAFF NOTE: I, Melinda Percy, MD FACP have personally reviewed patient's available data, including medical history, events of note, physical examination and test results as part of my evaluation. I have discussed with resident/NP and other care providers such as pharmacist, RN and RRT. In addition, I personally evaluated patient and elicited key findings of: pcxr unimpressive, CT with moderate effusion pre lasix, likley multifactorial with effusion with some atx compressive left, bicarb low causing sob / kussmaul, we likely have maxed lasix effort with rise crt, on hold, I have [place] chest at bedside, right is small and not likely to have any affect on SOB, left may have benefit to tap but she feels  better today for first time, she ambulated, would hold off thor today and re evaluate in am , i have a concern that we tap left and does not change her subjective SOB, would continue bicarb to 22 on labs, will follow up in am, upright, walk, IS   Melinda Berger. Melinda Alias, MD, FACP Pgr: [phone] [facility] 07/19/2015 5:54 PM

## 2015-07-19 NOTE — Procedures (Signed)
[place] chest 1. Small effusion right 2. Small to moderate effusion left with some small lung flap and compressive atx mild  Mcarthur Rossetti. Tyson Alias, MD, FACP Pgr: [phone] [facility]

## 2015-07-20 LAB — BASIC METABOLIC PANEL
Anion gap: 10 (ref 5–15)
BUN: 13 mg/dL (ref 6–20)
CALCIUM: 8.8 mg/dL — AB (ref 8.9–10.3)
CHLORIDE: 112 mmol/L — AB (ref 101–111)
CO2: 21 mmol/L — ABNORMAL LOW (ref 22–32)
CREATININE: 1.66 mg/dL — AB (ref 0.44–1.00)
GFR calc non Af Amer: 31 mL/min — ABNORMAL LOW (ref >60)
GFR, EST AFRICAN AMERICAN: 36 mL/min — AB (ref >60)
Glucose, Bld: 81 mg/dL (ref 65–99)
Potassium: 4.3 mmol/L (ref 3.5–5.1)
SODIUM: 143 mmol/L (ref 135–145)

## 2015-07-20 MED ORDER — CALCIUM CARBONATE-VITAMIN D 500-200 MG-UNIT PO TABS: 1.0000 | ORAL_TABLET | Freq: Two times a day (BID) | ORAL | Status: AC

## 2015-07-20 MED ORDER — SODIUM BICARBONATE 650 MG PO TABS: 650.0000 mg | ORAL_TABLET | Freq: Two times a day (BID) | ORAL | Status: DC

## 2015-07-20 NOTE — Discharge Summary (Signed)
Physician Discharge Summary  Melinda Berger WJX:[id number] DOB: [birth year] DOA: 07/12/2015  PCP: No primary care provider on file. Dr. Harrell Lark, [place] date: 07/12/2015 Discharge date: 07/20/2015  Time spent: Greater than 30 minutes  Recommendations for Outpatient Follow-up:  1. Dr. Harrell Lark, PCP: Patient has made an appointment to be seen on 07/23/15. To be seen with repeat labs (CBC & CMP). 2. Dr. Gwenevere Ghazi, Pulmonology: Patient has made an appointment to be seen on 07/24/15. Consider repeating chest x-ray. 3. Consider repeating 2-D echo in 2-3 months.  Discharge Diagnoses:  Principal Problem:   CAP (community acquired pneumonia) Active Problems:   GERD (gastroesophageal reflux disease)   Dyslipidemia   Migraine   Insomnia   Early Sepsis ([place])   Pleural effusion, bilateral   Discharge Condition: Improved & Stable  Diet recommendation: Heart healthy diet.  Filed Weights   07/14/15 2022 07/17/15 2129 07/19/15 2149  Weight: 96.662 kg (213 lb 1.6 oz) 97.27 kg (214 lb 7.1 oz) 96.98 kg (213 lb 12.8 oz)    History of present illness:  64 year old female with history of HTN, asthma, Barrett's esophagus, anxiety, GERD, chronic kidney disease , presented to [facility] ED on 07/13/15 with generalized fatigue, malaise , back pain, productive cough and dyspnea. In the ED, she was hypotensive and chest x-ray was suggestive of pneumonia. She was treated with IV levofloxacin for presumed community-acquired left lower lobe streptococcal pneumonia. She made slow gradual improvement for the first couple of days. On 10/7 she complained of feeling worse-worsening dyspnea and mid scapular pain. She also provided new history of right leg DVT in 2012-completed 6 months of Coumadin. Concern for PE and hence obtain d-dimer which was positive. CTA chest-no PE but moderate bilateral pleural effusion. Patient dyspneic on minimal exertion. Trial of IV Lasix 40 mg every 12 and  bicarbonate drip on 10/8. Creatinine bumped up. Dyspnea better. Pulmonology consulted 10/9  Hospital Course:   Community-acquired pneumonia (pneumococcal ) -left lower lobe - Apparently failed outpatient oral Keflex treatment - Met sepsis criteria on admission : hypotension , tachycardia and chest x-ray consistent with pneumonia - Will need follow-up chest x-ray in 3-4 weeks to ensure resolution of pneumonia findings - Urine strep pneumococcal antigen positive. Urine Legionella antigen: Negative - Blood cultures 2: Negative. HIV screen: Nonreactive - Completed treatment with 7 days course of levofloxacin. - CTA chest 10/7: No PE or aortic dissection. Moderate bilateral pleural effusions.  Bilateral pleural effusion - Etiology unclear but may be transudative? Secondary to hypoalbuminemia versus possible CHF - 2-D echo: Results as below shows grade 2 diastolic dysfunction and moderate to severe pulmonary hypertension - Pulmonology consulted. As per their input: Though this was parapneumonic, suspect that the effusions are related to volume resuscitation (i.e. likely transudate) and they have decreased significantly with diuresis. The effusions were felt to be too small for thoracentesis and given her ongoing clinical improvement, don't think there is a role for sampling at this point. - Pulmonology recommended completing antibiotic course (done), repeating chest x-ray later this week with her pulmonologist. - It has been explained to the patient that if she has chest pain, increasing dyspnea on malaise, she will need to have repeat chest x-ray to ensure that it's not increasing in size and she verbalizes understanding.  Moderate to severe pulmonary hypertension - Possibly from chronic underlying lung disease-? Etiology. Gives a 10 year history of asthma. Remote history of mild smoking. Follows with Dr. Claris Gladden, Pulmonology. - As per pulmonology,  difficult to assess for only  hypertension in the setting of acute pulmonary problem i.e. pneumonia. They recommend repeating 2-D echo in 2-3 months.  Sepsis, present on admission-secondary to pneumococcal pneumonia - Secondary to community-acquired pneumonia. - Sepsis physiology resolved.  Acute on stage III chronic kidney disease - Creatinine on admission 1.7. Baseline creatinine: 1.2-1.4. Creatinine has worsened from 1.3-1.6 post IV Lasix.  - Lasix discontinued. Patient advised to avoid nephrotoxic medications i.e. NSAIDs, diuretics, ACEI/ARB and she verbalizes understanding. Ad lib. oral hydration. Follow-up BMP in the next few days. -Holding home dose of thiazide/ARB at this time. Creatinine has remained stable in the 1.6 range over the last 24 hours.   Hypokalemia - Replaced  Non-anion gap metabolic acidosis - Bicarbonate has progressively decreased from 20 on admission to 12 on 10/5. Unclear etiology -? Secondary to hyperchloremia.  - Lactate normal on 10/3 - Bicarbonate has gradually improved to 21 with IV/by mouth bicarbonate replacement. As per pulmonology, this acidosis could have also been contributing factor to her dyspnea apart from pleural effusions.  Hypocalcemia/vitamin D deficiency - Corrected serum calcium of 7.6. Started Os-Cal with D. Asymptomatic. Outpatient follow-up with vitamin D levels (last time a couple of months ago was in the teens). Corrected serum calcium is 10. We will discharge on reduced dose of Os-Cal plus D. Outpatient follow-up with repeat CMP, vitamin D levels. This had been discussed with patient's daughter who is a PA at patient's primary MDs office.  Chronic anemia - Stable  Barrett's esophagus/GERD/large sliding hiatus hernia by CT chest - PPI  Dyslipidemia - Continue home medications  Asthma - Stable  History of right leg DVT in 2012 - Completed Coumadin anticoagulation    Pulmonology have cleared patient for discharge home  today.   Consultants:  Pulmonology  Procedures:  2-D echo 07/18/15: Study Conclusions  - Left ventricle: The cavity size was normal. Systolic function was vigorous. The estimated ejection fraction was in the range of 65% to 70%. Wall motion was normal; there were no regional wall motion abnormalities. Features are consistent with a pseudonormal left ventricular filling pattern, with concomitant abnormal relaxation and increased filling pressure (grade 2 diastolic dysfunction). Doppler parameters are consistent with mildly elevated ventricular end-diastolic filling pressure. - Aortic valve: There was no regurgitation. - Aortic root: The aortic root was normal in size. - Mitral valve: There was moderate regurgitation. - Left atrium: The atrium was mildly dilated. - Right ventricle: The cavity size was mildly dilated. Wall thickness was normal. - Right atrium: The atrium was moderately dilated. - Tricuspid valve: There was moderate regurgitation. - Pulmonic valve: There was no regurgitation. - Pulmonary arteries: Systolic pressure was moderately to severely increased. PA peak pressure: 53 mm Hg (S). - Inferior vena cava: The vessel was dilated. The respirophasic diameter changes were blunted (< 50%), consistent with elevated central venous pressure. - Pericardium, extracardiac: There was no pericardial effusion.  Impressions:  - Hyperdynamic LVEF with mildly elevated filling pressures. Dilaed RV and RA with moderate TR and moderate to severe pulmonary hypertension suggestive of underlying lung problem.  Antibiotics:  IV levofloxacin 07/12/15 >  Discharge Exam:  Complaints: Feels much better. Dyspnea significantly improved. States that she walked 3 times from her room to the nursing station without significant dyspnea. Anxious to go home.  Filed Vitals:   07/19/15 1008 07/19/15 1651 07/19/15 2149 07/20/15 0540  BP: 142/50 145/74 141/61 130/72   Pulse: 63 68 75 72  Temp: 98.1 F (36.7 C) 98.2 F (36.8 C)  98.4 F (36.9 C) 98.7 F (37.1 C)  TempSrc: Oral Oral Oral Oral  Resp: '17 17 18 17  ' Height:   5' (1.524 m)   Weight:   96.98 kg (213 lb 12.8 oz)   SpO2: 98% 100% 98% 99%    General exam: Pleasant middle-aged female sitting up comfortably in chair  Respiratory system: Reduced breath sounds in the bases. Rest of lung fields clear to auscultation. No increased work of breathing. Cardiovascular system: S1 & S2 heard, RRR. No JVD, murmurs, gallops, clicks. Trace bilateral leg edema. Gastrointestinal system: Abdomen is nondistended, soft and nontender. Normal bowel sounds heard. Central nervous system: Alert and oriented. No focal neurological deficits. Extremities: Symmetric 5 x 5 power.  Discharge Instructions      Discharge Instructions    (HEART FAILURE PATIENTS) Call MD:  Anytime you have any of the following symptoms: 1) 3 pound weight gain in 24 hours or 5 pounds in 1 week 2) shortness of breath, with or without a dry hacking cough 3) swelling in the hands, feet or stomach 4) if you have to sleep on extra pillows at night in order to breathe.    Complete by:  As directed      Call MD for:  difficulty breathing, headache or visual disturbances    Complete by:  As directed      Call MD for:  extreme fatigue    Complete by:  As directed      Call MD for:  hives    Complete by:  As directed      Call MD for:  persistant dizziness or light-headedness    Complete by:  As directed      Call MD for:  persistant nausea and vomiting    Complete by:  As directed      Call MD for:  severe uncontrolled pain    Complete by:  As directed      Call MD for:  temperature >100.4    Complete by:  As directed      Diet - low sodium heart healthy    Complete by:  As directed      Increase activity slowly    Complete by:  As directed             Medication List    STOP taking these medications        cephALEXin 500 MG  capsule  Commonly known as:  KEFLEX     losartan-hydrochlorothiazide 100-12.5 MG tablet  Commonly known as:  HYZAAR      TAKE these medications        albuterol (2.5 MG/3ML) 0.083% nebulizer solution  Commonly known as:  PROVENTIL  Inhale 3 mLs into the lungs daily as needed. For shortness of breath     betamethasone dipropionate 0.05 % cream  Commonly known as:  DIPROLENE  Apply 1 application topically daily as needed. For leg irritation     butalbital-acetaminophen-caffeine 50-325-40 MG tablet  Commonly known as:  FIORICET, ESGIC  Take 1 tablet by mouth 2 (two) times daily as needed for headache.     calcium-vitamin D 500-200 MG-UNIT tablet  Commonly known as:  OSCAL WITH D  Take 1 tablet by mouth 2 (two) times daily.     CARAFATE 1 GM/10ML suspension  Generic drug:  sucralfate  Take 10 mLs by mouth 4 (four) times daily as needed (for heartburn).     clopidogrel 75 MG tablet  Commonly known as:  PLAVIX  Take 75 mg by mouth every Monday, Wednesday, and Friday.     cyclobenzaprine 10 MG tablet  Commonly known as:  FLEXERIL  Take 10 mg by mouth 3 (three) times daily as needed for muscle spasms.     escitalopram 20 MG tablet  Commonly known as:  LEXAPRO  Take 20 mg by mouth at bedtime.     FLECTOR 1.3 % Ptch  Generic drug:  diclofenac  Place 1 patch onto the skin daily as needed (for back pain).     fluticasone 50 MCG/ACT nasal spray  Commonly known as:  FLONASE  Place 2 sprays into both nostrils daily.     Fluticasone-Salmeterol 250-50 MCG/DOSE Aepb  Commonly known as:  ADVAIR  Inhale 1 puff into the lungs 2 (two) times daily.     gabapentin 100 MG capsule  Commonly known as:  NEURONTIN  Take 100 mg by mouth 4 (four) times daily.     LORazepam 0.5 MG tablet  Commonly known as:  ATIVAN  Take 0.5 mg by mouth every 6 (six) hours as needed. For anxiety     montelukast 10 MG tablet  Commonly known as:  SINGULAIR  Take 10 mg by mouth at bedtime.      nitrofurantoin 50 MG capsule  Commonly known as:  MACRODANTIN  Take 50 mg by mouth daily.     nystatin 100000 UNIT/GM Powd  Apply 1 g topically daily as needed (for fungal irritation).     oxyCODONE 5 MG immediate release tablet  Commonly known as:  Oxy IR/ROXICODONE  Take 10 mg by mouth every 4 (four) hours as needed for severe pain.     PROLIA 60 MG/ML Soln injection  Generic drug:  denosumab  Inject 60 mLs into the muscle See admin instructions. Takes injections twice a year     RABEprazole 20 MG tablet  Commonly known as:  ACIPHEX  Take 20 mg by mouth daily.     sodium bicarbonate 650 MG tablet  Take 1 tablet (650 mg total) by mouth 2 (two) times daily.     temazepam 30 MG capsule  Commonly known as:  RESTORIL  Take 30 mg by mouth at bedtime as needed for sleep.     topiramate 100 MG tablet  Commonly known as:  TOPAMAX  Take 100 mg by mouth at bedtime.       Follow-up Information    Follow up with Dr. Harrell Lark  On 07/23/2015.   Why:  To be seen with repeat labs (CBC & CMP)      Follow up with BEAUFORD,WAYNE, MD On 07/24/2015.   Specialty:  Pulmonary Disease   Why:  Consider follow up Chest Xray.   Contact information:   [facility] [address] [place] [address] [phone]        The results of significant diagnostics from this hospitalization (including imaging, microbiology, ancillary and laboratory) are listed below for reference.    Significant Diagnostic Studies: Dg Chest 2 View  07/19/2015   CLINICAL DATA:  Admitted to the hospital 1 week ago with pneumonia and subsequent bilateral pleural effusions. Follow-up.  EXAM: CHEST  2 VIEW  COMPARISON:  CT 07/17/2015.  Radiography 07/12/2015.  FINDINGS: The heart is mildly enlarged. There is a large hiatal hernia. There are bilateral pleural effusions with mild basilar atelectasis. Upper lungs are clear.  IMPRESSION: Bilateral effusions with basilar atelectasis.   Electronically Signed    By: Nelson Chimes M.D.   On: 07/19/2015  08:46   Dg Chest 2 View  07/12/2015   CLINICAL DATA:  Fever cough chills and chest pain for 3 days.  EXAM: CHEST  2 VIEW  COMPARISON:  10/07/2011  FINDINGS: There is focal airspace opacity in the posterior base region, probably a left lower lobe infiltrate. There is a large hiatal hernia. The right lung is clear. There is no pleural effusion. The pulmonary vasculature is normal.  IMPRESSION: 1. Left lower lobe infiltrate. This may represent pneumonia. Followup PA and lateral chest X-ray is recommended in 3-4 weeks following trial of antibiotic therapy to ensure resolution and exclude underlying malignancy. 2. Hiatal hernia.   Electronically Signed   By: Andreas Newport M.D.   On: 07/12/2015 23:36   Ct Angio Chest Pe W/cm &/or Wo Cm  07/17/2015   CLINICAL DATA:  Shortness of breath.  EXAM: CT ANGIOGRAPHY CHEST WITH CONTRAST  TECHNIQUE: Multidetector CT imaging of the chest was performed using the standard protocol during bolus administration of intravenous contrast. Multiplanar CT image reconstructions and MIPs were obtained to evaluate the vascular anatomy.  CONTRAST:  110m OMNIPAQUE IOHEXOL 350 MG/ML SOLN  COMPARISON:  Chest radiograph of July 12, 2015.  FINDINGS: No pneumothorax is noted. Moderate bilateral pleural effusions are noted with adjacent subsegmental atelectasis. There is no evidence of pulmonary embolus. Large sliding-type hiatal hernia is noted. Visualized portion of upper abdomen is unremarkable. No mediastinal mass or adenopathy is noted. 1 cm low density is noted in left thyroid lobe. There is no evidence of thoracic aortic dissection or aneurysm. Mild coronary artery calcifications are noted. No significant osseous abnormality is noted in the chest.  Review of the MIP images confirms the above findings.  IMPRESSION: No evidence of pulmonary embolus.  Moderate bilateral pleural effusions are noted.  Large sliding-type hiatal hernia is noted.    Electronically Signed   By: JMarijo Conception M.D.   On: 07/17/2015 17:26    Microbiology: Recent Results (from the past 240 hour(s))  Urine culture     Status: None   Collection Time: 07/12/15 11:25 PM  Result Value Ref Range Status   Specimen Description URINE, CLEAN CATCH  Final   Special Requests NONE  Final   Culture   Final    MULTIPLE SPECIES PRESENT, SUGGEST RECOLLECTION [facility]   Report Status 07/14/2015 FINAL  Final  Blood Culture (routine x 2)     Status: None   Collection Time: 07/13/15 12:20 AM  Result Value Ref Range Status   Specimen Description BLOOD LEFT ANTECUBITAL  Final   Special Requests BOTTLES DRAWN AEROBIC AND ANAEROBIC 10CC  Final   Culture   Final    NO GROWTH 5 DAYS [facility]   Report Status 07/18/2015 FINAL  Final  Blood Culture (routine x 2)     Status: None   Collection Time: 07/13/15 12:25 AM  Result Value Ref Range Status   Specimen Description BLOOD RIGHT ANTECUBITAL  Final   Special Requests BOTTLES DRAWN AEROBIC AND ANAEROBIC 10CC  Final   Culture   Final    NO GROWTH 5 DAYS [facility]   Report Status 07/18/2015 FINAL  Final  MRSA PCR Screening     Status: Abnormal   Collection Time: 07/13/15  2:24 AM  Result Value Ref Range Status   MRSA by PCR POSITIVE (A) NEGATIVE Final    Comment:        The GeneXpert MRSA  Assay (FDA approved for NASAL specimens only), is one component of a comprehensive MRSA colonization surveillance program. It is not intended to diagnose MRSA infection nor to guide or monitor treatment for MRSA infections. CRITICAL RESULT CALLED TO, READ BACK BY AND VERIFIED WITH: BUFFY.S RN @ 06:56 10/ CRITICAL RESULT CALLED TO, READ BACK BY AND VERIFIED WITH: BUFFY.S RN @ 6:56 07/13/2015 BY [email or web address]      Labs: Basic Metabolic Panel:  Recent Labs Lab 07/16/15 0610 07/17/15 0525 07/18/15 0530 07/19/15 0708 07/20/15 0502  NA 139 138 142 140  143  K 4.3 3.8 4.2 4.9 4.3  CL 114* 111 115* 109 112*  CO2 18* 19* 17* 19* 21*  GLUCOSE 91 86 86 78 81  BUN '8 7 7 11 13  ' CREATININE 1.25* 1.26* 1.32* 1.69* 1.66*  CALCIUM 6.8* 6.9* 7.4* 8.0* 8.8*   Liver Function Tests: No results for input(s): AST, ALT, ALKPHOS, BILITOT, PROT, ALBUMIN in the last 168 hours. No results for input(s): LIPASE, AMYLASE in the last 168 hours. No results for input(s): AMMONIA in the last 168 hours. CBC:  Recent Labs Lab 07/15/15 0745  WBC 7.5  HGB 9.6*  HCT 29.4*  MCV 92.5  PLT 169   Cardiac Enzymes: No results for input(s): CKTOTAL, CKMB, CKMBINDEX, TROPONINI in the last 168 hours. BNP: BNP (last 3 results)  Recent Labs  07/19/15 1300  BNP 273.3*    ProBNP (last 3 results) No results for input(s): PROBNP in the last 8760 hours.  CBG:  Recent Labs Lab 07/13/15 1642  GLUCAP 92      Signed:  Vernell Leep, MD, FACP, FHM. [facility] Pager [phone]  If 7PM-7AM, please contact night-coverage [email or web address] Password [email or web address] 07/20/2015, 12:14 PM

## 2015-07-20 NOTE — Discharge Instructions (Signed)
Community-Acquired Pneumonia, Adult °Pneumonia is an infection of the lungs. There are different types of pneumonia. One type can develop while a person is in a hospital. A different type, called community-acquired pneumonia, develops in people who are not, or have not recently been, in the hospital or other health care facility.  °CAUSES °Pneumonia may be caused by bacteria, viruses, or funguses. Community-acquired pneumonia is often caused by Streptococcus pneumonia bacteria. These bacteria are often passed from one person to another by breathing in droplets from the cough or sneeze of an infected person. °RISK FACTORS °The condition is more likely to develop in: °· People who have chronic diseases, such as chronic obstructive pulmonary disease (COPD), asthma, congestive heart failure, cystic fibrosis, diabetes, or kidney disease. °· People who have early-stage or late-stage HIV. °· People who have sickle cell disease. °· People who have had their spleen removed (splenectomy). °· People who have poor dental hygiene. °· People who have medical conditions that increase the risk of breathing in (aspirating) secretions their own mouth and nose.   °· People who have a weakened immune system (immunocompromised). °· People who smoke. °· People who travel to areas where pneumonia-causing germs commonly exist. °· People who are around animal habitats or animals that have pneumonia-causing germs, including birds, bats, rabbits, cats, and farm animals. °SYMPTOMS °Symptoms of this condition include: °· A dry cough. °· A wet (productive) cough. °· Fever. °· Sweating. °· Chest pain, especially when breathing deeply or coughing. °· Rapid breathing or difficulty breathing. °· Shortness of breath. °· Shaking chills. °· Fatigue. °· Muscle aches. °DIAGNOSIS °Your health care provider will take a medical history and perform a physical exam. You may also have other tests, including: °· Imaging studies of your chest, including  X-rays. °· Tests to check your blood oxygen level and other blood gases. °· Other tests on blood, mucus (sputum), fluid around your lungs (pleural fluid), and urine. °If your pneumonia is severe, other tests may be done to identify the specific cause of your illness. °TREATMENT °The type of treatment that you receive depends on many factors, such as the cause of your pneumonia, the medicines you take, and other medical conditions that you have. For most adults, treatment and recovery from pneumonia may occur at home. In some cases, treatment must happen in a hospital. Treatment may include: °· Antibiotic medicines, if the pneumonia was caused by bacteria. °· Antiviral medicines, if the pneumonia was caused by a virus. °· Medicines that are given by mouth or through an IV tube. °· Oxygen. °· Respiratory therapy. °Although rare, treating severe pneumonia may include: °· Mechanical ventilation. This is done if you are not breathing well on your own and you cannot maintain a safe blood oxygen level. °· Thoracentesis. This procedure removes fluid around one lung or both lungs to help you breathe better. °HOME CARE INSTRUCTIONS °· Take over-the-counter and prescription medicines only as told by your health care provider. °¨ Only take cough medicine if you are losing sleep. Understand that cough medicine can prevent your body's natural ability to remove mucus from your lungs. °¨ If you were prescribed an antibiotic medicine, take it as told by your health care provider. Do not stop taking the antibiotic even if you start to feel better. °· Sleep in a semi-upright position at night. Try sleeping in a reclining chair, or place a few pillows under your head. °· Do not use tobacco products, including cigarettes, chewing tobacco, and e-cigarettes. If you need help quitting, ask your health care provider. °· Drink enough water to keep your urine   clear or pale yellow. This will help to thin out mucus secretions in your  lungs. PREVENTION There are ways that you can decrease your risk of developing community-acquired pneumonia. Consider getting a pneumococcal vaccine if:  You are older than 66 years of age.  You are older than 66 years of age and are undergoing cancer treatment, have chronic lung disease, or have other medical conditions that affect your immune system. Ask your health care provider if this applies to you. There are different types and schedules of pneumococcal vaccines. Ask your health care provider which vaccination option is best for you. You may also prevent community-acquired pneumonia if you take these actions:  Get an influenza vaccine every year. Ask your health care provider which type of influenza vaccine is best for you.  Go to the dentist on a regular basis.  Wash your hands often. Use hand sanitizer if soap and water are not available. SEEK MEDICAL CARE IF:  You have a fever.  You are losing sleep because you cannot control your cough with cough medicine. SEEK IMMEDIATE MEDICAL CARE IF:  You have worsening shortness of breath.  You have increased chest pain.  Your sickness becomes worse, especially if you are an older adult or have a weakened immune system.  You cough up blood.   This information is not intended to replace advice given to you by your health care provider. Make sure you discuss any questions you have with your health care provider.   Document Released: 09/26/2005 Document Revised: 06/17/2015 Document Reviewed: 01/21/2015 Elsevier Interactive Patient Education 2016 [employer]  Pleural Effusion A pleural effusion is an abnormal buildup of fluid in the layers of tissue between your lungs and the inside of your chest (pleural space). These two layers of tissue that line both your lungs and the inside of your chest are called pleura. Usually, there is no air in the space between the pleura, only a thin layer of fluid. If left untreated, a large amount of  fluid can build up and cause the lung to collapse. A pleural effusion is usually caused by another disease that requires treatment. The two main types of pleural effusion are:  Transudative pleural effusion. This happens when fluid leaks into the pleural space because of a low protein count in your blood or high blood pressure in your vessels. Heart failure often causes this.  Exudative infusion. This occurs when fluid collects in the pleural space from blocked blood vessels or lymph vessels. Some lung diseases, injuries, and cancers can cause this type of effusion. CAUSES Pleural effusion can be caused by:  Heart failure.  A blood clot in the lung (pulmonary embolism).  Pneumonia.  Cancer.  Liver failure (cirrhosis).  Kidney disease.  Complications from surgery, such as from open heart surgery. SIGNS AND SYMPTOMS In some cases, pleural effusion may cause no symptoms. Symptoms can include:  Shortness of breath, especially when lying down.  Chest pain, often worse when taking a deep breath.  Fever.  Dry cough that is lasting (chronic).  Hiccups.  Rapid breathing. An underlying condition that is causing the pleural effusion (such as heart failure, pneumonia, blood clots, tuberculosis, or cancer) may also cause additional symptoms. DIAGNOSIS Your health care provider may suspect pleural effusion based on your symptoms and medical history. Your health care provider will also do a physical exam and a chest X-ray. If the X-ray shows there is fluid in your chest, you may need to have this fluid removed using  a needle (thoracentesis) so it can be tested. You may also have:  Imaging studies of the chest, such as:  Ultrasound.  CT scan.  Blood tests for kidney and liver function. TREATMENT Treatment depends on the cause of the pleural effusion. Treatment may include:  Taking antibiotic medicines to clear up an infection that is causing the pleural effusion.  Placing a tube  in the chest to drain the effusion (tube thoracostomy). This procedure is often used when there is an infection in the fluid.  Surgery to remove the fibrous outer layer of tissue from the pleural space (decortication).  Thoracentesis, which can improve cough and shortness of breath.  A procedure to put medicine into the chest cavity to seal the pleural space to prevent fluid buildup (pleurodesis).  Chemotherapy and radiation therapy. These may be required in the case of cancerous (malignant) pleural effusion. HOME CARE INSTRUCTIONS  Take medicines only as directed by your health care provider.  Keep track of how long you can gently exercise before you get short of breath. Try simply walking at first.  Do not use any tobacco products, including cigarettes, chewing tobacco, or electronic cigarettes. If you need help quitting, ask your health care provider.  Keep all follow-up visits as directed by your health care provider. This is important. SEEK MEDICAL CARE IF:  The amount of time that you are able to exercise decreases or does not improve with time.  You have pain or signs of infection at the puncture site if you had thoracentesis. Watch for:  Drainage.  Redness.  Swelling.  You have a fever. SEEK IMMEDIATE MEDICAL CARE IF:  You are short of breath.  You develop chest pain.  You develop a new cough. MAKE SURE YOU:  Understand these instructions.  Will watch your condition.  Will get help right away if you are not doing well or get worse.   This information is not intended to replace advice given to you by your health care provider. Make sure you discuss any questions you have with your health care provider.   Document Released: 09/26/2005 Document Revised: 10/17/2014 Document Reviewed: 02/19/2014 Elsevier Interactive Patient Education 2016 [employer]   Acute Kidney Injury Acute kidney injury is any condition in which there is sudden (acute) damage to the  kidneys. Acute kidney injury was previously known as acute kidney failure or acute renal failure. The kidneys are two organs that lie on either side of the spine between the middle of the back and the front of the abdomen. The kidneys:  Remove wastes and extra water from the blood.   Produce important hormones. These help keep bones strong, regulate blood pressure, and help create red blood cells.   Balance the fluids and chemicals in the blood and tissues. A small amount of kidney damage may not cause problems, but a large amount of damage may make it difficult or impossible for the kidneys to work the way they should. Acute kidney injury may develop into long-lasting (chronic) kidney disease. It may also develop into a life-threatening disease called end-stage kidney disease. Acute kidney injury can get worse very quickly, so it should be treated right away. Early treatment may prevent other kidney diseases from developing. CAUSES   A problem with blood flow to the kidneys. This may be caused by:   Blood loss.   Heart disease.   Severe burns.   Liver disease.  Direct damage to the kidneys. This may be caused by:  Some medicines.  A kidney infection.   Poisoning or consuming toxic substances.   A surgical wound.   A blow to the kidney area.   A problem with urine flow. This may be caused by:   Cancer.   Kidney stones.   An enlarged prostate. SIGNS AND SYMPTOMS   Swelling (edema) of the legs, ankles, or feet.   Tiredness (lethargy).   Nausea or vomiting.   Confusion.   Problems with urination, such as:   Painful or burning feeling during urination.   Decreased urine production.   Frequent accidents in children who are potty trained.   Bloody urine.   Muscle twitches and cramps.   Shortness of breath.   Seizures.   Chest pain or pressure. Sometimes, no symptoms are present. DIAGNOSIS Acute kidney injury may be detected and  diagnosed by tests, including blood, urine, imaging, or kidney biopsy tests.  TREATMENT Treatment of acute kidney injury varies depending on the cause and severity of the kidney damage. In mild cases, no treatment may be needed. The kidneys may heal on their own. If acute kidney injury is more severe, your health care provider will treat the cause of the kidney damage, help the kidneys heal, and prevent complications from occurring. Severe cases may require a procedure to remove toxic wastes from the body (dialysis) or surgery to repair kidney damage. Surgery may involve:   Repair of a torn kidney.   Removal of an obstruction. HOME CARE INSTRUCTIONS  Follow your prescribed diet.  Take medicines only as directed by your health care provider.  Do not take any new medicines (prescription, over-the-counter, or nutritional supplements) unless approved by your health care provider. Many medicines can worsen your kidney damage or may need to have the dose adjusted.   Keep all follow-up visits as directed by your health care provider. This is important.  Observe your condition to make sure you are healing as expected. SEEK IMMEDIATE MEDICAL CARE IF:  You are feeling ill or have severe pain in the back or side.   Your symptoms return or you have new symptoms.  You have any symptoms of end-stage kidney disease. These include:   Persistent itchiness.   Loss of appetite.   Headaches.   Abnormally dark or light skin.  Numbness in the hands or feet.   Easy bruising.   Frequent hiccups.   Menstruation stops.   You have a fever.  You have increased urine production.  You have pain or bleeding when urinating. MAKE SURE YOU:   Understand these instructions.  Will watch your condition.  Will get help right away if you are not doing well or get worse.   This information is not intended to replace advice given to you by your health care provider. Make sure you discuss  any questions you have with your health care provider.   Document Released: 04/11/2011 Document Revised: 10/17/2014 Document Reviewed: 05/25/2012 Elsevier Interactive Patient Education [employer]

## 2015-07-20 NOTE — Progress Notes (Signed)
Name: Melinda Berger MRN: [id number] DOB: Feb 24, 1951    ADMISSION DATE:  07/12/2015 CONSULTATION DATE:  10/9  REFERRING MD :  Hongalgi   CHIEF COMPLAINT:  Dyspnea and pleural effusions   BRIEF PATIENT DESCRIPTION:  64 year old female w/ h/o asthma, CKD, and HTN admitted on 10/3 w/ what was eventually diagnosed as pneumococcal PNA based on + strep antigen. Treated w/ approp abx & initially improved; then on 10/7 felt worse. CT chest neg for PE but had bilateral effusions L>R. Initially treated w/ diuretics but creatinine bumped. PCCM asked to eval for dyspnea and effusions.  She was seen by pulmonary on 10/9 and her left sided effusion was noted to be small to moderate and free-flowing without obvious fibrotic tissue.  She was feeling better at that point and had diuresed some.     SIGNIFICANT EVENTS    STUDIES:  CT chest 10/8: No evidence of pulmonary embolus.Moderate bilateral pleural effusions are noted.Large sliding-type hiatal hernia is noted. ECHO 10/8: EF 65-70%; no wall motion abnormality, Features are consistent with a pseudonormal left ventricular filling pattern, with concomitant abnormalrelaxation and increased filling pressure (grade 2 diastolic dysfunction). Doppler parameters are consistent with mildlyelevated ventricular end-diastolic filling pressure. PA peak pressure: 53 mmHg. Right ventricle: The cavity size was mildly dilated.    SUBJECTIVE:  Breathing is better, she wants to go home    VITAL SIGNS: Temp:  [98.1 F (36.7 C)-98.7 F (37.1 C)] 98.7 F (37.1 C) (10/10 0540) Pulse Rate:  [63-75] 72 (10/10 0540) Resp:  [17-18] 17 (10/10 0540) BP: (130-145)/(50-74) 130/72 mmHg (10/10 0540) SpO2:  [98 %-100 %] 99 % (10/10 0540) Weight:  [213 lb 12.8 oz (96.98 kg)] 213 lb 12.8 oz (96.98 kg) (10/09 2149)  PHYSICAL EXAMINATION:  General: comfortable in chair Head and neck: normocephalic, atraumatic, oropharynx clear Pulmonary: good air movement, no wheezing,  diminished breath sounds left base with dullness to percussion Cardiovascular: RRR, no mgr GI: BS+, soft, nontend MSK: normal bulk and tone   Recent Labs Lab 07/18/15 0530 07/19/15 0708 07/20/15 0502  NA 142 140 143  K 4.2 4.9 4.3  CL 115* 109 112*  CO2 17* 19* 21*  BUN CREATININE 1.32* 1.69* 1.66*  GLUCOSE 86 78 81    Recent Labs Lab 07/15/15 0745  HGB 9.6*  HCT 29.4*  WBC 7.5  PLT 169   10/9 CXR images personally reviewed showing improved left sided effusion compared to the 10/2 CXR and the 10/7 CT chest  ASSESSMENT / PLAN: Bilateral pleural effusions> Though this was parapneumonic I suspect that the effusion are related to volume resuscitation (ie. Likely transudate) as they have decreased significantly with diuresis. They were felt to be too small for a thoracentesis on 10/9 and given her ongoing clinical improvement I don't think that there is a role for sampling them at this point.    Plan Complete abx course Repeat CXR later this week with her pulmonologist I explained to her that if she has chest pain, increasing dyspnea or malaise then she will need to have a repeat CXR to ensure the pleural effusion is not increasing in size  PH by echo> difficult to assess for PH in the setting of an acute pulmonary problem (ie Pneumonia)  Plan Would repeat echo 2-3 mo  CKD stage III Scr increased w/ lasix  Plan Keep even at this point   PCCM will sign off  I think she could be discharged home at this point  Heber [place], MD [facility] Pager: [phone] Cell: [phone] After 3pm or if no response, call [phone]   07/20/2015 9:10 AM

## 2016-07-05 ENCOUNTER — Emergency Department (HOSPITAL_BASED_OUTPATIENT_CLINIC_OR_DEPARTMENT_OTHER)
Admission: EM | Admit: 2016-07-05 | Discharge: 2016-07-06 | Disposition: A | Discharge status: Home / Self Care | Payer: Managed Care, Other (non HMO) | Attending: Emergency Medicine | Admitting: Emergency Medicine

## 2016-07-05 ENCOUNTER — Encounter (HOSPITAL_BASED_OUTPATIENT_CLINIC_OR_DEPARTMENT_OTHER): Payer: Self-pay | Admitting: *Deleted

## 2016-07-05 ENCOUNTER — Emergency Department (HOSPITAL_BASED_OUTPATIENT_CLINIC_OR_DEPARTMENT_OTHER): Payer: Managed Care, Other (non HMO)

## 2016-07-05 DIAGNOSIS — N189 Chronic kidney disease, unspecified: Secondary | ICD-10-CM | POA: Insufficient documentation

## 2016-07-05 DIAGNOSIS — Z79899 Other long term (current) drug therapy: Secondary | ICD-10-CM | POA: Insufficient documentation

## 2016-07-05 DIAGNOSIS — J45909 Unspecified asthma, uncomplicated: Secondary | ICD-10-CM | POA: Diagnosis not present

## 2016-07-05 DIAGNOSIS — M546 Pain in thoracic spine: Secondary | ICD-10-CM | POA: Diagnosis present

## 2016-07-05 DIAGNOSIS — R079 Chest pain, unspecified: Secondary | ICD-10-CM | POA: Diagnosis not present

## 2016-07-05 DIAGNOSIS — I129 Hypertensive chronic kidney disease with stage 1 through stage 4 chronic kidney disease, or unspecified chronic kidney disease: Secondary | ICD-10-CM | POA: Insufficient documentation

## 2016-07-05 HISTORY — DX: Pneumonia, unspecified organism: J18.9

## 2016-07-05 NOTE — ED Triage Notes (Signed)
Pain between shoulder blades when taking a deep breath x 1 day.  Fever of 102 PTA.  Hx of PNA.

## 2016-07-05 NOTE — ED Triage Notes (Signed)
Denies SOB, CP 

## 2016-07-06 ENCOUNTER — Emergency Department (HOSPITAL_BASED_OUTPATIENT_CLINIC_OR_DEPARTMENT_OTHER): Payer: Managed Care, Other (non HMO)

## 2016-07-06 LAB — URINALYSIS, ROUTINE W REFLEX MICROSCOPIC
Bilirubin Urine: NEGATIVE
Glucose, UA: NEGATIVE mg/dL
HGB URINE DIPSTICK: NEGATIVE
Ketones, ur: NEGATIVE mg/dL
NITRITE: NEGATIVE
PROTEIN: NEGATIVE mg/dL
Specific Gravity, Urine: 1.019 (ref 1.005–1.030)
pH: 5.5 (ref 5.0–8.0)

## 2016-07-06 LAB — CBC WITH DIFFERENTIAL/PLATELET
BASOS ABS: 0 10*3/uL (ref 0.0–0.1)
Basophils Relative: 0 %
EOS ABS: 0.2 10*3/uL (ref 0.0–0.7)
EOS PCT: 3 %
HCT: 36.6 % (ref 36.0–46.0)
Hemoglobin: 11.7 g/dL — ABNORMAL LOW (ref 12.0–15.0)
LYMPHS ABS: 2.8 10*3/uL (ref 0.7–4.0)
LYMPHS PCT: 32 %
MCH: 29.8 pg (ref 26.0–34.0)
MCHC: 32 g/dL (ref 30.0–36.0)
MCV: 93.4 fL (ref 78.0–100.0)
MONO ABS: 1.4 10*3/uL — AB (ref 0.1–1.0)
Monocytes Relative: 15 %
Neutro Abs: 4.5 10*3/uL (ref 1.7–7.7)
Neutrophils Relative %: 50 %
PLATELETS: 174 10*3/uL (ref 150–400)
RBC: 3.92 MIL/uL (ref 3.87–5.11)
RDW: 13 % (ref 11.5–15.5)
WBC: 8.9 10*3/uL (ref 4.0–10.5)

## 2016-07-06 LAB — COMPREHENSIVE METABOLIC PANEL
ALT: 14 U/L (ref 14–54)
ANION GAP: 4 — AB (ref 5–15)
AST: 18 U/L (ref 15–41)
Albumin: 3.3 g/dL — ABNORMAL LOW (ref 3.5–5.0)
Alkaline Phosphatase: 95 U/L (ref 38–126)
BUN: 22 mg/dL — ABNORMAL HIGH (ref 6–20)
CHLORIDE: 114 mmol/L — AB (ref 101–111)
CO2: 21 mmol/L — ABNORMAL LOW (ref 22–32)
CREATININE: 1.47 mg/dL — AB (ref 0.44–1.00)
Calcium: 8.3 mg/dL — ABNORMAL LOW (ref 8.9–10.3)
GFR, EST AFRICAN AMERICAN: 42 mL/min — AB (ref >60)
GFR, EST NON AFRICAN AMERICAN: 36 mL/min — AB (ref >60)
Glucose, Bld: 101 mg/dL — ABNORMAL HIGH (ref 65–99)
POTASSIUM: 3.7 mmol/L (ref 3.5–5.1)
SODIUM: 139 mmol/L (ref 135–145)
Total Bilirubin: 0.5 mg/dL (ref 0.3–1.2)
Total Protein: 6.1 g/dL — ABNORMAL LOW (ref 6.5–8.1)

## 2016-07-06 LAB — URINE MICROSCOPIC-ADD ON

## 2016-07-06 LAB — LIPASE, BLOOD: LIPASE: 30 U/L (ref 11–51)

## 2016-07-06 MED ORDER — IOPAMIDOL (ISOVUE-370) INJECTION 76%
80.0000 mL | Freq: Once | INTRAVENOUS | Status: AC | PRN
  Administered 2016-07-06: 80 mL via INTRAVENOUS

## 2016-07-06 MED ORDER — KETOROLAC TROMETHAMINE 30 MG/ML IJ SOLN
30.0000 mg | Freq: Once | INTRAMUSCULAR | Status: AC
  Administered 2016-07-06: 30 mg via INTRAVENOUS
  Filled 2016-07-06: qty 1

## 2016-07-06 MED ORDER — SODIUM CHLORIDE 0.9 % IV BOLUS (SEPSIS)
1000.0000 mL | Freq: Once | INTRAVENOUS | Status: AC
  Administered 2016-07-06: 1000 mL via INTRAVENOUS

## 2016-07-06 NOTE — ED Provider Notes (Signed)
[facility] Provider Note   CSN: [id number] Arrival date & time: 07/05/16  2117     History   Chief Complaint Chief Complaint  Patient presents with  . Back Pain    HPI Melinda Berger is a 65 y.o. female, Who is a Engineer, civil (consulting)nurse, presenting today with back pain. Patient states this has been going on for the past 24 hours. She has bilateral upper back pain that is worse with a deep breath. She has had a fever 102 at home. She is taking Flexeril and Tylenol without any significant relief. She denies any coughing or recent infections. She states she has a history of pneumonia and states this feels similar. Patient is also concern that she has a urinary tract infection because she has frequency and inability to make it to the bathroom before urinating on herself. She has a history of DVT but no history of pulmonary and wasn't. She denies any recent long drives her periods of immobilization, no recent surgeries. No estrogen use. There are no further complaints.  10 Systems reviewed and are negative for acute change except as noted in the HPI.    HPI  Past Medical History:  Diagnosis Date  . Anxiety   . Arthritis   . Asthma   . Barrett esophagus   . Chronic kidney disease   . Depression   . GERD (gastroesophageal reflux disease)   . Heart murmur   . History of hiatal hernia   . Hypertension   . Migraines   . Pneumonia 06/2015  . Thyroid disease     Patient Active Problem List   Diagnosis Date Noted  . Pleural effusion, bilateral   . CAP (community acquired pneumonia) 07/13/2015  . GERD (gastroesophageal reflux disease) 07/13/2015  . Dyslipidemia 07/13/2015  . Migraine 07/13/2015  . Insomnia 07/13/2015  . Early Sepsis (HCC) 07/13/2015  . Barrett's esophagus 10/07/2011    Class: Chronic  . Fracture of ulna, right, open 10/07/2011    Class: Hospitalized for  . Dog bite of forearm 10/07/2011    Class: Hospitalized for  . Multiple abrasions 10/07/2011    Class: Present  on Admission  . GOITER, MULTINODULAR 04/28/2008  . HYPERTHYROIDISM 03/17/2008  . HYPERLIPIDEMIA 03/17/2008  . DEPRESSION 03/17/2008  . ASTHMA 03/17/2008  . GERD 03/17/2008  . CARDIAC MURMUR 03/17/2008    Past Surgical History:  Procedure Laterality Date  . EYE SURGERY     bilateral cataract removal  . I&D EXTREMITY  10/07/2011   Procedure: IRRIGATION AND DEBRIDEMENT EXTREMITY;  Surgeon: Eldred MangesMark C Yates;  Location: [facility] OR;  Service: Orthopedics;  Laterality: Right;  . JOINT REPLACEMENT     Left Knee Replacement  . ORIF ULNAR FRACTURE  10/07/2011   Procedure: OPEN REDUCTION INTERNAL FIXATION (ORIF) ULNAR FRACTURE;  Surgeon: Eldred MangesMark C Yates;  Location: [facility] OR;  Service: Orthopedics;  Laterality: Right;  . REPLACEMENT TOTAL KNEE Left   . ROTATOR CUFF REPAIR    . TONSILLECTOMY      OB History    No data available       Home Medications    Prior to Admission medications   Medication Sig Start Date End Date Taking? Authorizing Provider  albuterol (PROVENTIL) (2.5 MG/3ML) 0.083% nebulizer solution Inhale 3 mLs into the lungs daily as needed. For shortness of breath 10/18/10   Historical Provider, MD  betamethasone dipropionate (DIPROLENE) 0.05 % cream Apply 1 application topically daily as needed. For leg irritation 05/30/15   Historical Provider, MD  butalbital-acetaminophen-caffeine (FIORICET,  ESGIC) 50-325-40 MG tablet Take 1 tablet by mouth 2 (two) times daily as needed for headache.    Historical Provider, MD  calcium-vitamin D (OSCAL WITH D) 500-200 MG-UNIT tablet Take 1 tablet by mouth 2 (two) times daily. 07/20/15   Elease Etienne, MD  CARAFATE 1 GM/10ML suspension Take 10 mLs by mouth 4 (four) times daily as needed (for heartburn).  06/30/15   Historical Provider, MD  clopidogrel (PLAVIX) 75 MG tablet Take 75 mg by mouth every Monday, Wednesday, and Friday.    Historical Provider, MD  cyclobenzaprine (FLEXERIL) 10 MG tablet Take 10 mg by mouth 3 (three) times daily as needed for  muscle spasms.    Historical Provider, MD  escitalopram (LEXAPRO) 20 MG tablet Take 20 mg by mouth at bedtime.      Historical Provider, MD  FLECTOR 1.3 % PTCH Place 1 patch onto the skin daily as needed (for back pain).  06/08/15   Historical Provider, MD  fluticasone (FLONASE) 50 MCG/ACT nasal spray Place 2 sprays into both nostrils daily.    Historical Provider, MD  Fluticasone-Salmeterol (ADVAIR) 250-50 MCG/DOSE AEPB Inhale 1 puff into the lungs 2 (two) times daily.    Historical Provider, MD  gabapentin (NEURONTIN) 100 MG capsule Take 100 mg by mouth 4 (four) times daily. 06/24/15   Historical Provider, MD  LORazepam (ATIVAN) 0.5 MG tablet Take 0.5 mg by mouth every 6 (six) hours as needed. For anxiety     Historical Provider, MD  montelukast (SINGULAIR) 10 MG tablet Take 10 mg by mouth at bedtime.    Historical Provider, MD  nitrofurantoin (MACRODANTIN) 50 MG capsule Take 50 mg by mouth daily.    Historical Provider, MD  nystatin (MYCOSTATIN/NYSTOP) 100000 UNIT/GM POWD Apply 1 g topically daily as needed (for fungal irritation).    Historical Provider, MD  oxyCODONE (OXY IR/ROXICODONE) 5 MG immediate release tablet Take 10 mg by mouth every 4 (four) hours as needed for severe pain.     Historical Provider, MD  PROLIA 60 MG/ML SOLN injection Inject 60 mLs into the muscle See admin instructions. Takes injections twice a year 04/28/15   Historical Provider, MD  RABEprazole (ACIPHEX) 20 MG tablet Take 20 mg by mouth daily.     Historical Provider, MD  sodium bicarbonate 650 MG tablet Take 1 tablet (650 mg total) by mouth 2 (two) times daily. 07/20/15   Elease Etienne, MD  temazepam (RESTORIL) 30 MG capsule Take 30 mg by mouth at bedtime as needed for sleep.    Historical Provider, MD  topiramate (TOPAMAX) 100 MG tablet Take 100 mg by mouth at bedtime.     Historical Provider, MD    Family History No family history on file.  Social History Social History  Substance Use Topics  . Smoking  status: Never Smoker  . Smokeless tobacco: Never Used  . Alcohol use No     Allergies   Amoxicillin-pot clavulanate; Nsaids; Sulfa antibiotics; and Zolpidem tartrate   Review of Systems Review of Systems   Physical Exam Updated Vital Signs BP 111/55 (BP Location: Right Arm)   Pulse (!) 54   Temp 98 F (36.7 C) (Oral)   Resp 18   Ht 5' (1.524 m)   Wt 185 lb (83.9 kg)   SpO2 97%   BMI 36.13 kg/m   Physical Exam  Constitutional: She is oriented to person, place, and time. She appears well-developed and well-nourished. No distress.  Appears fatigued  HENT:  Head: Normocephalic and  atraumatic.  Nose: Nose normal.  Mouth/Throat: Oropharynx is clear and moist. No oropharyngeal exudate.  Eyes: Conjunctivae and EOM are normal. Pupils are equal, round, and reactive to light. No scleral icterus.  Neck: Normal range of motion. Neck supple. No JVD present. No tracheal deviation present. No thyromegaly present.  Cardiovascular: Normal rate, regular rhythm and normal heart sounds.  Exam reveals no gallop and no friction rub.   No murmur heard. Pulmonary/Chest: Effort normal and breath sounds normal. No respiratory distress. She has no wheezes. She exhibits no tenderness.  Abdominal: Soft. Bowel sounds are normal. She exhibits no distension and no mass. There is tenderness. There is no rebound and no guarding.  Suprapubic TTP  Musculoskeletal: Normal range of motion. She exhibits edema. She exhibits no tenderness.  Lymphadenopathy:    She has no cervical adenopathy.  Neurological: She is alert and oriented to person, place, and time. No cranial nerve deficit. She exhibits normal muscle tone.  Skin: Skin is warm and dry. No rash noted. No erythema. No pallor.  Nursing note and vitals reviewed.    ED Treatments / Results  Labs (all labs ordered are listed, but only abnormal results are displayed) Labs Reviewed  CBC WITH DIFFERENTIAL/PLATELET - Abnormal; Notable for the following:        Result Value   Hemoglobin 11.7 (*)    Monocytes Absolute 1.4 (*)    All other components within normal limits  COMPREHENSIVE METABOLIC PANEL - Abnormal; Notable for the following:    Chloride 114 (*)    CO2 21 (*)    Glucose, Bld 101 (*)    BUN 22 (*)    Creatinine, Ser 1.47 (*)    Calcium 8.3 (*)    Total Protein 6.1 (*)    Albumin 3.3 (*)    GFR calc non Af Amer 36 (*)    GFR calc Af Amer 42 (*)    Anion gap 4 (*)    All other components within normal limits  URINALYSIS, ROUTINE W REFLEX MICROSCOPIC (NOT AT [facility]) - Abnormal; Notable for the following:    Leukocytes, UA TRACE (*)    All other components within normal limits  URINE MICROSCOPIC-ADD ON - Abnormal; Notable for the following:    Squamous Epithelial / LPF 0-5 (*)    Bacteria, UA RARE (*)    Casts HYALINE CASTS (*)    All other components within normal limits  URINE CULTURE  LIPASE, BLOOD    EKG  EKG Interpretation None       Radiology Dg Chest 2 View  Result Date: 07/05/2016 CLINICAL DATA:  Acute onset of upper back pain and fever. Initial encounter. EXAM: CHEST  2 VIEW COMPARISON:  Chest radiograph performed 07/19/2015, and CTA of the chest performed 07/11/2015 FINDINGS: The lungs are well-aerated and clear. There is no evidence of focal opacification, pleural effusion or pneumothorax. The heart is normal in size; the mediastinal contour is within normal limits. No acute osseous abnormalities are seen. A moderate fluid and air filled hiatal hernia is noted. IMPRESSION: 1. No acute cardiopulmonary process seen. 2. Moderate fluid and air filled hiatal hernia noted. Electronically Signed   By: Roanna Raider M.D.   On: 07/05/2016 22:32   Ct Angio Chest Pe W Or Wo Contrast  Result Date: 07/06/2016 CLINICAL DATA:  Posterior chest pain for 2 days, fever. History of asthma and pneumonia. EXAM: CT ANGIOGRAPHY CHEST WITH CONTRAST TECHNIQUE: Multidetector CT imaging of the chest was performed using the standard  protocol during bolus administration of intravenous contrast. Multiplanar CT image reconstructions and MIPs were obtained to evaluate the vascular anatomy. CONTRAST:  80 cc Isovue 370 COMPARISON:  Chest radiograph July 05, 2016 and CT chest July 17, 2015 FINDINGS: CARDIOVASCULAR: Adequate contrast opacification of the pulmonary artery's. Main pulmonary artery is not enlarged. No pulmonary arterial filling defects to the level of the subsegmental branches. Heart size is normal, no right heart strain. Mild coronary artery calcifications. No pericardial effusions. Thoracic aorta is normal course and caliber, mild calcific atherosclerosis of aortic arch. MEDIASTINUM/NODES: No lymphadenopathy by CT size criteria. LUNGS/PLEURA: Tracheobronchial tree is patent, no pneumothorax. 4 mm ground-glass nodule RIGHT upper lobe is below size followup recommendation. Bibasilar dependent atelectasis. UPPER ABDOMEN: Large hiatal hernia. MUSCULOSKELETAL: Visualized soft tissues and included osseous structures are nonacute. Exaggerated kyphosis. No acute osseous process. Mild degenerative change of the thoracic spine. Probable old pin track LEFT proximal humerus. Review of the MIP images confirms the above findings. IMPRESSION: No acute cardiopulmonary process or acute pulmonary embolism. Stable large hiatal hernia. Electronically Signed   By: Awilda Metro M.D.   On: 07/06/2016 04:41    Procedures Procedures (including critical care time)  Medications Ordered in ED Medications  ketorolac (TORADOL) 30 MG/ML injection 30 mg (30 mg Intravenous Given 07/06/16 0217)  sodium chloride 0.9 % bolus 1,000 mL (1,000 mLs Intravenous New Bag/Given 07/06/16 0200)  iopamidol (ISOVUE-370) 76 % injection 80 mL (80 mLs Intravenous Contrast Given 07/06/16 0316)     Initial Impression / Assessment and Plan / ED Course  I have reviewed the triage vital signs and the nursing notes.  Pertinent labs & imaging results that were  available during my care of the patient were reviewed by me and considered in my medical decision making (see chart for details).  Clinical Course    Patient presents to the ED for back pain. It is pleuritic.  PE and pneumonia are possible.  Will evaluate with CTA.  UA also pending for UTI.  She was given IVF and toradol.    4:49 AM Labs at normal baseline.  UA neg for UTI.  CT neg for pneumonia or PE.  Upon repeat eval, she is feeling much better, VS are normal, she is resting comfortably.  I am not sure what is causing her symptoms, but I do not believe it comes from an emergent condition.  PCP fu advised within 3 days. Patient safe for DC.   Final Clinical Impressions(s) / ED Diagnoses   Final diagnoses:  Midline thoracic back pain    New Prescriptions New Prescriptions   No medications on file     Tomasita Crumble, MD 07/06/16 [phone]

## 2016-07-07 LAB — URINE CULTURE

## 2016-10-06 IMAGING — CR DG CHEST 2V
2 series · 2 of 2 positions shown · non-contrast
Comparison: Chest radiograph performed 07/19/2015, and CTA of the
chest performed 07/11/2015

CLINICAL DATA: Acute onset of upper back pain and fever. Initial
encounter.

EXAM:
CHEST  2 VIEW

[w chest pa]
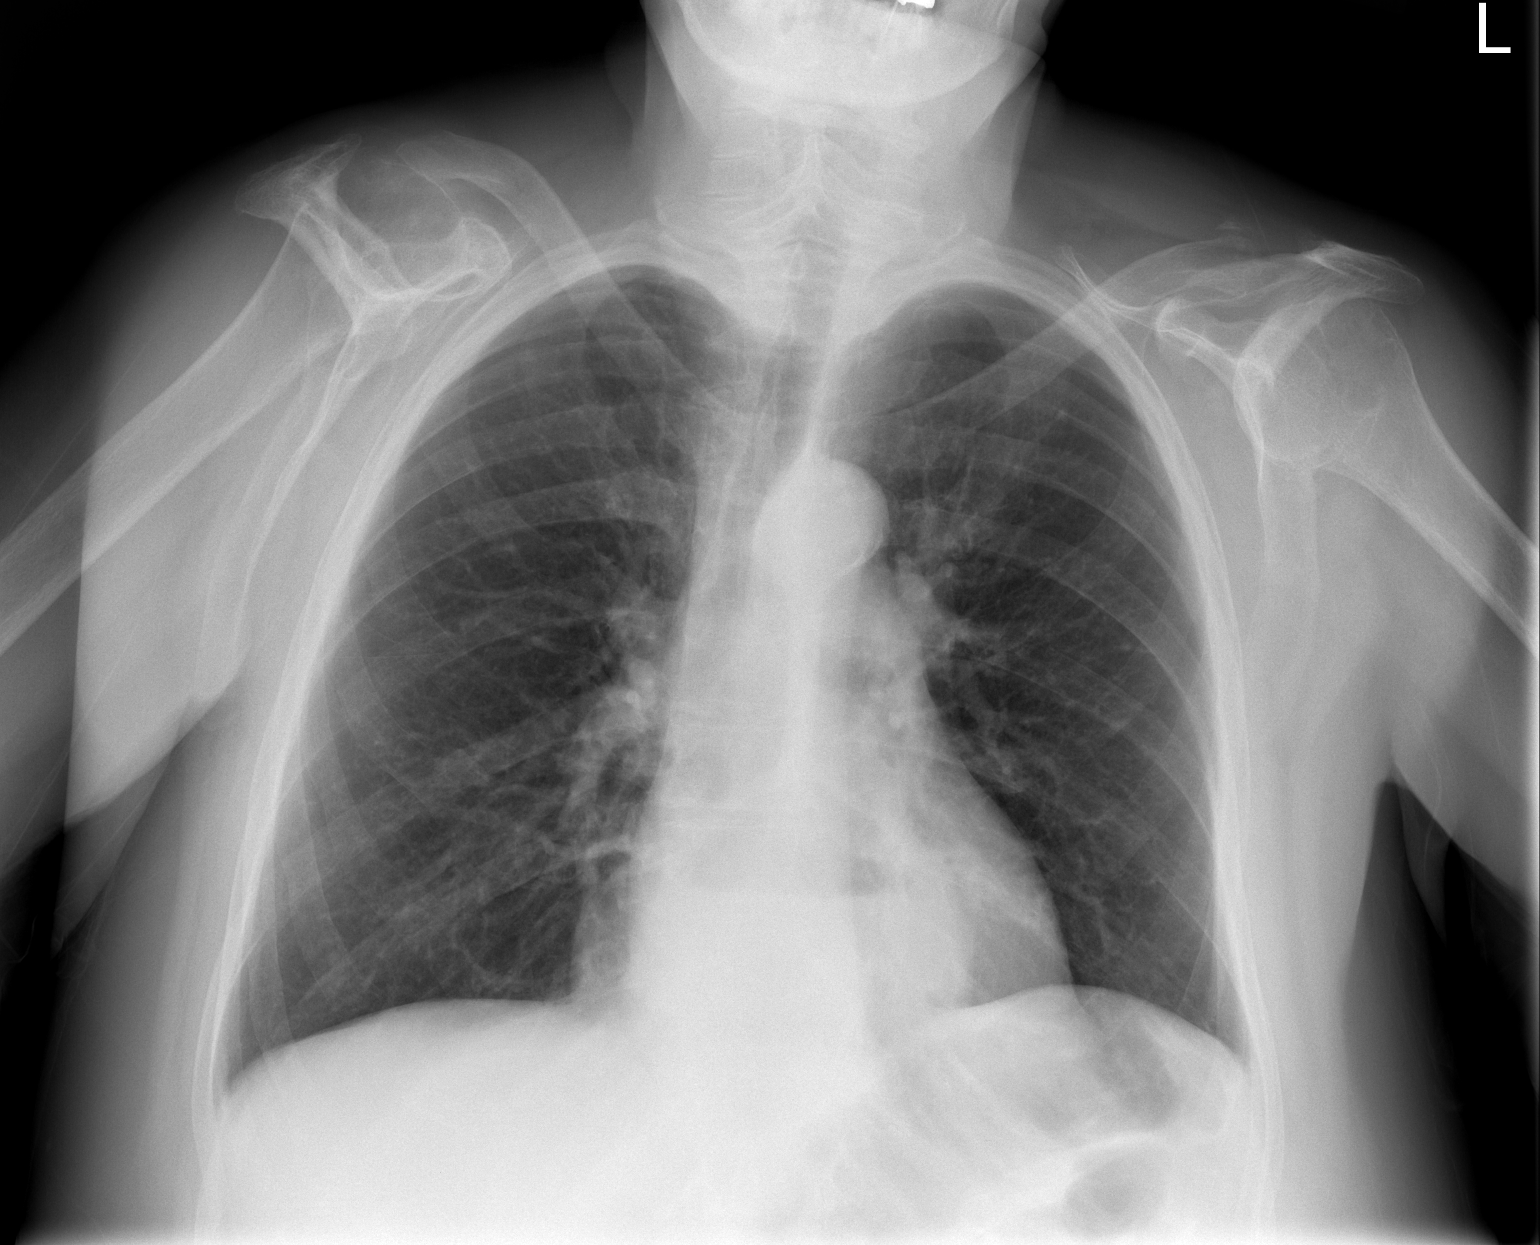

[w chest lat]
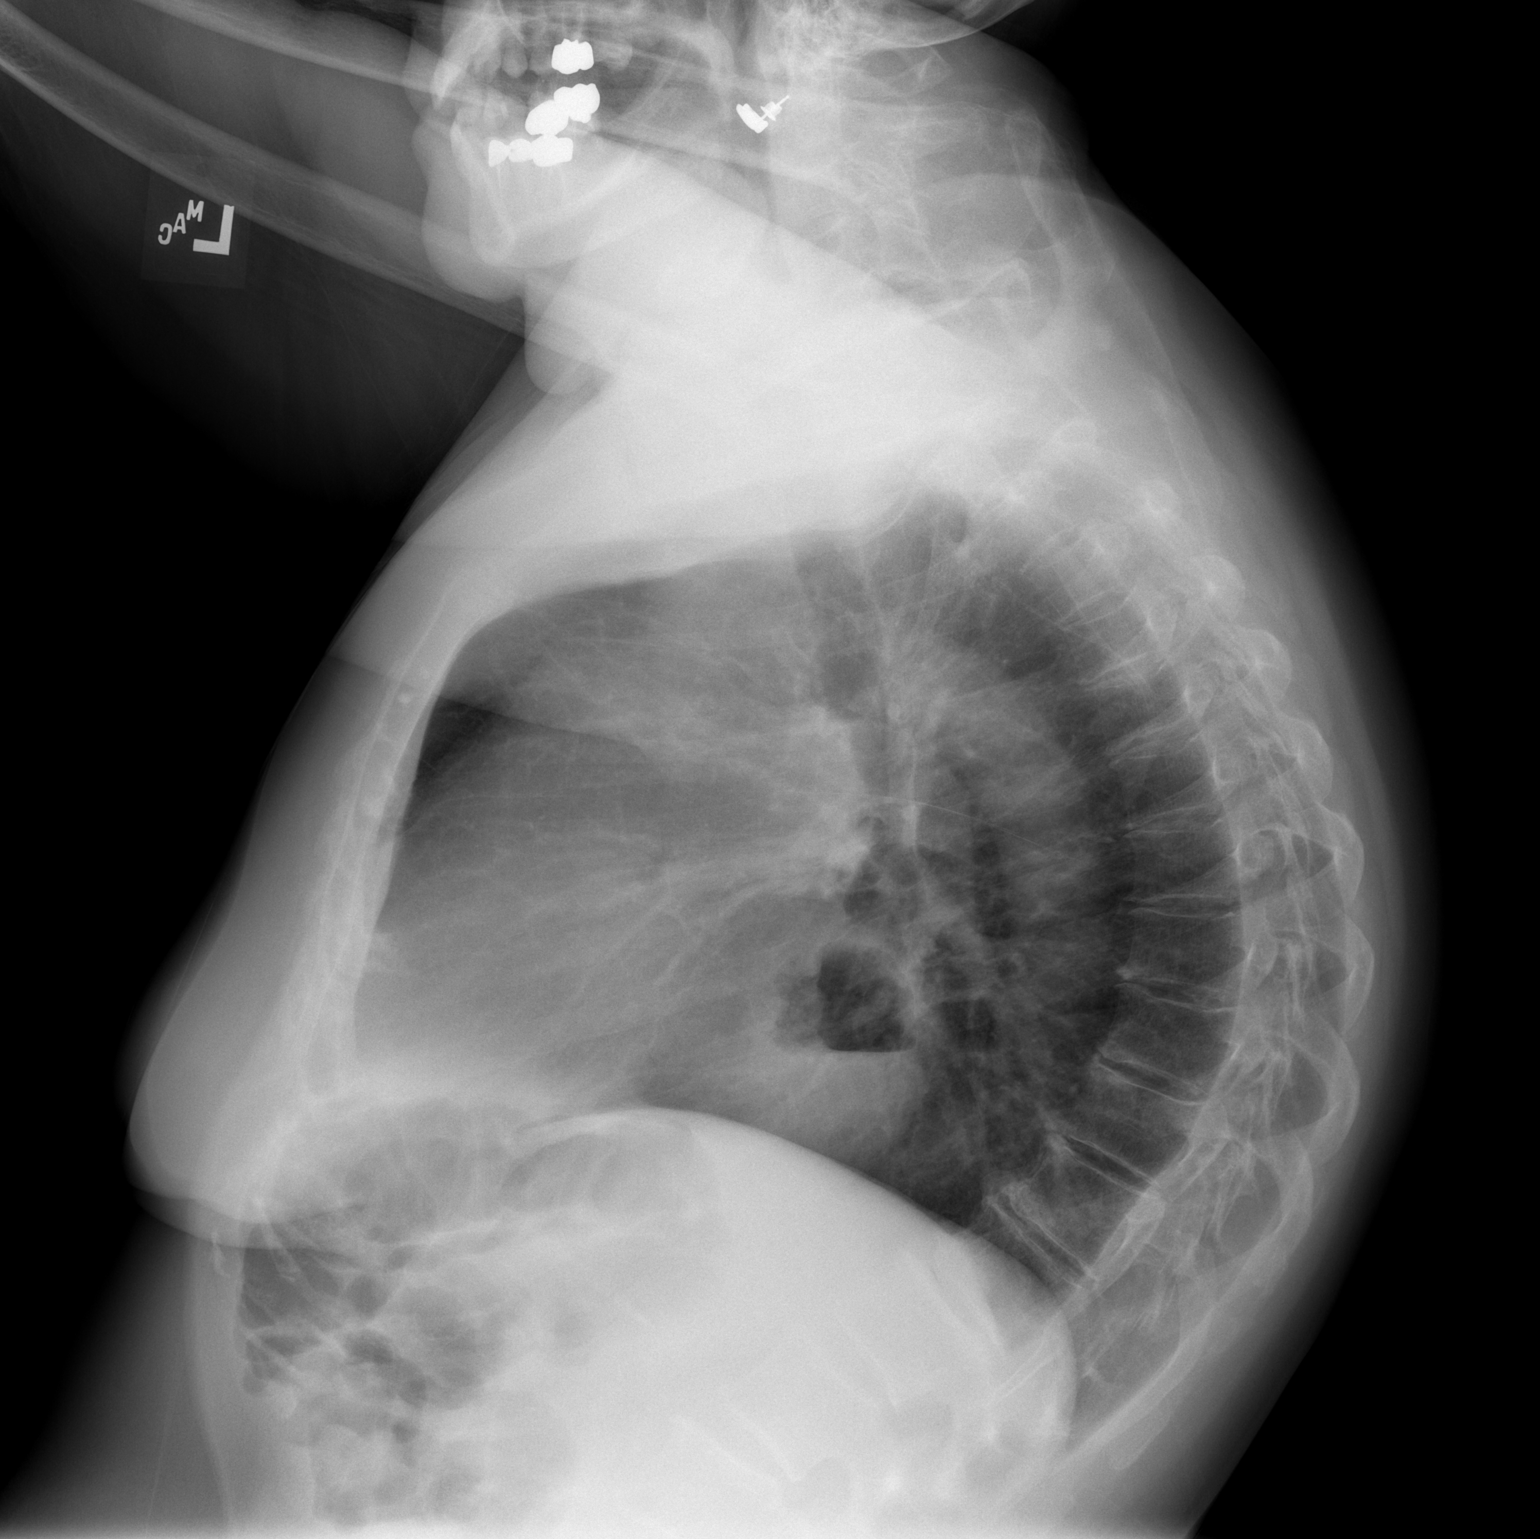

[2 of 2 positions shown; findings below may reference images not displayed]

FINDINGS: The lungs are well-aerated and clear. There is no evidence of focal
opacification, pleural effusion or pneumothorax.

The heart is normal in size; the mediastinal contour is within
normal limits. No acute osseous abnormalities are seen. A moderate
fluid and air filled hiatal hernia is noted.
IMPRESSION: 1. No acute cardiopulmonary process seen.
2. Moderate fluid and air filled hiatal hernia noted.

## 2016-10-07 IMAGING — CT CT ANGIO CHEST
2 of 6 series · 18 of 36 positions shown · IV contrast (isovue)
Comparison: Chest radiograph July 05, 2016 and CT chest
July 17, 2015

CLINICAL DATA: Posterior chest pain for 2 days, fever. History of
asthma and pneumonia.

EXAM:
CT ANGIOGRAPHY CHEST WITH CONTRAST
TECHNIQUE: Multidetector CT imaging of the chest was performed using the
standard protocol during bolus administration of intravenous
contrast. Multiplanar CT image reconstructions and MIPs were
obtained to evaluate the vascular anatomy.
CONTRAST:  80 cc Isovue 370

[Series 10: pe coronal mpr · coronal · 0.46mm/px · 1 of 123 slices shown]
[im 62/123  mediastinal]
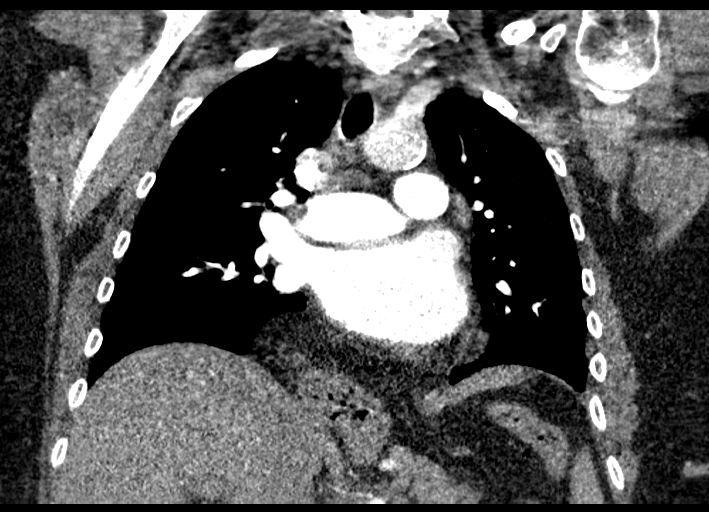

[Series 16: pe thins · axial · 0.63mm/px · z∈[-182,+20]mm · 17 of 227 slices shown]
[im 12/227  lung]
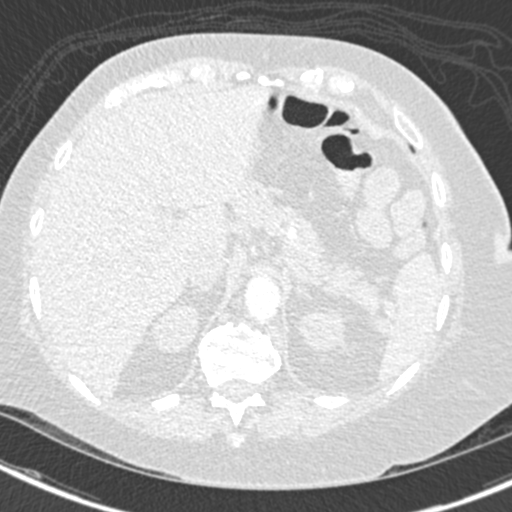
[im 23/227  mediastinal]
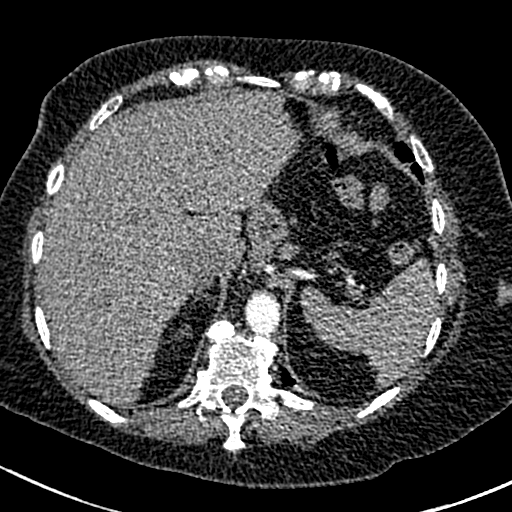
[im 34/227  lung]
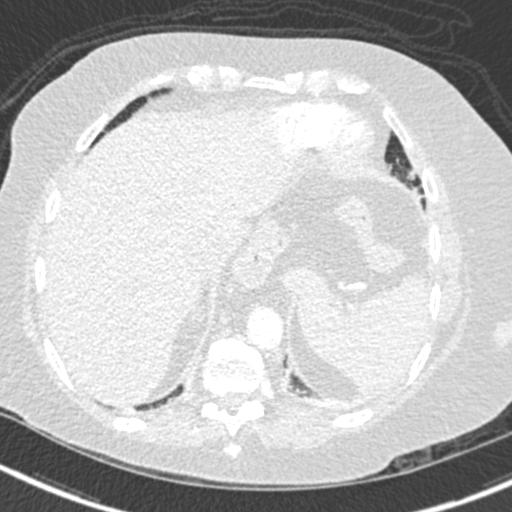
[im 46/227  mediastinal]
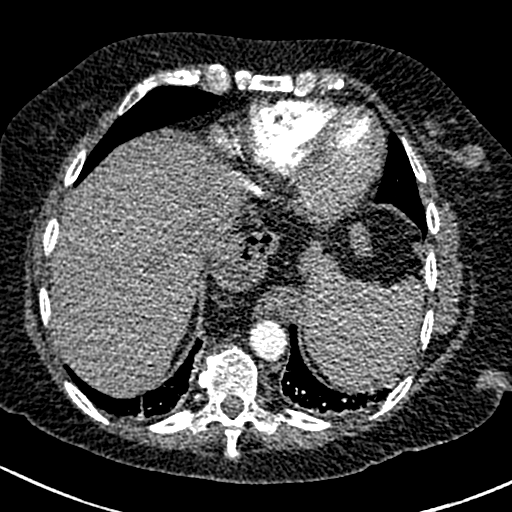
[im 68/227  lung]
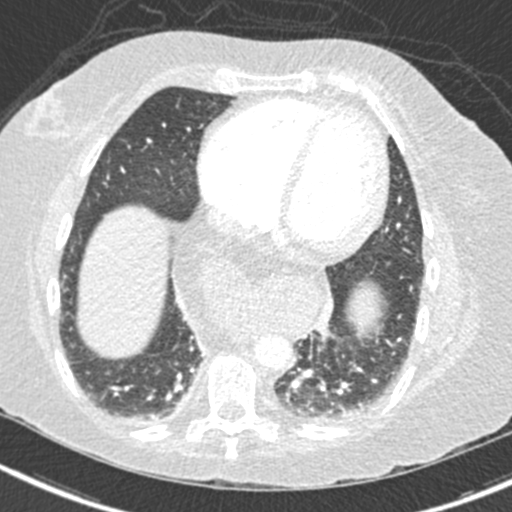
[im 80/227  mediastinal]
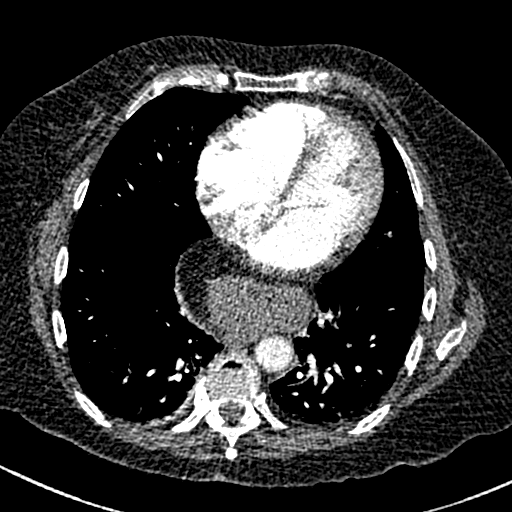
[im 91/227  lung]
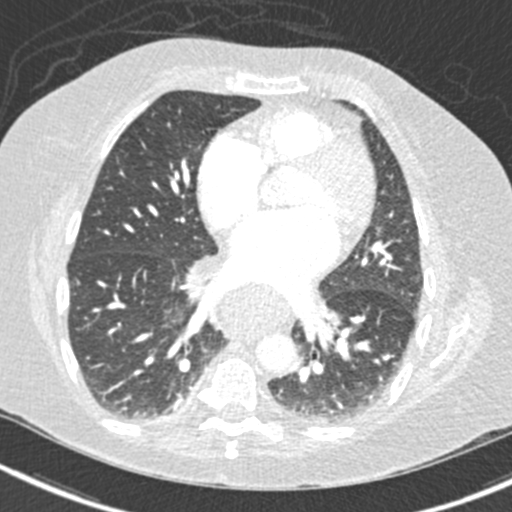
[im 102/227  mediastinal]
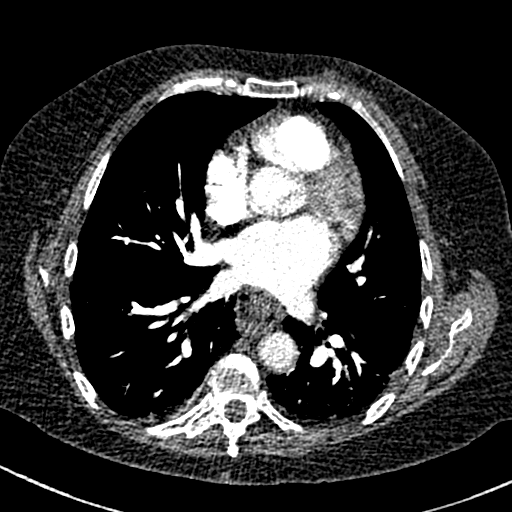
[im 114/227  lung]
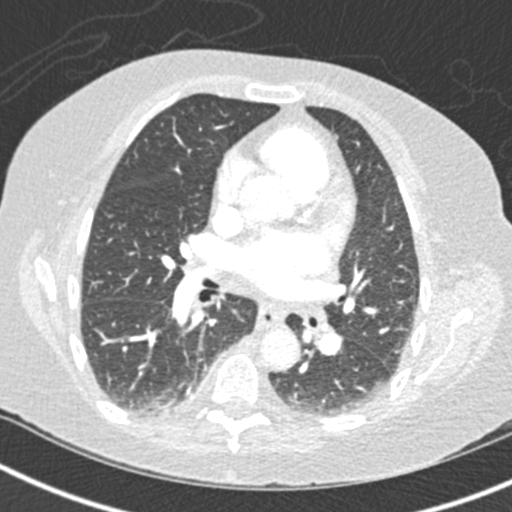
[im 125/227  mediastinal]
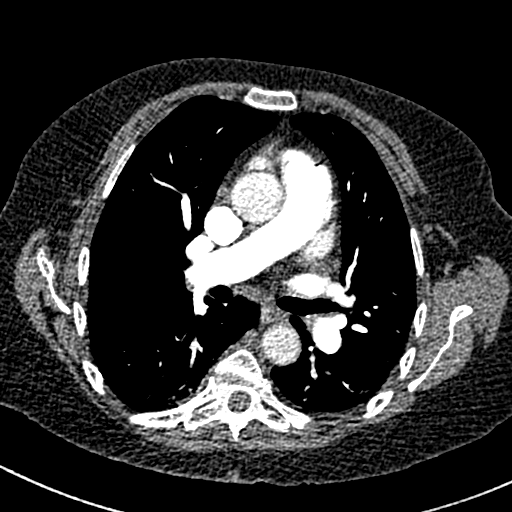
[im 136/227  lung]
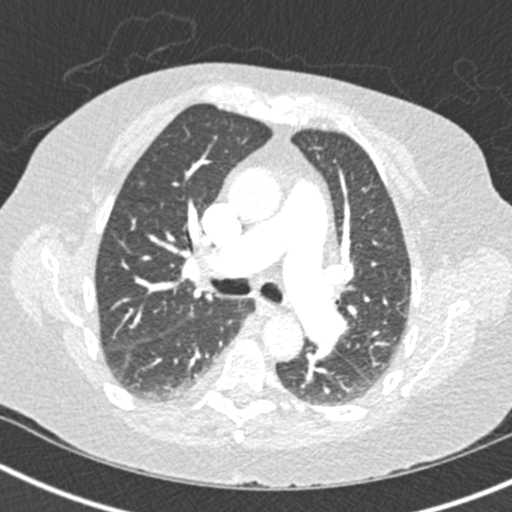
[im 147/227  mediastinal]
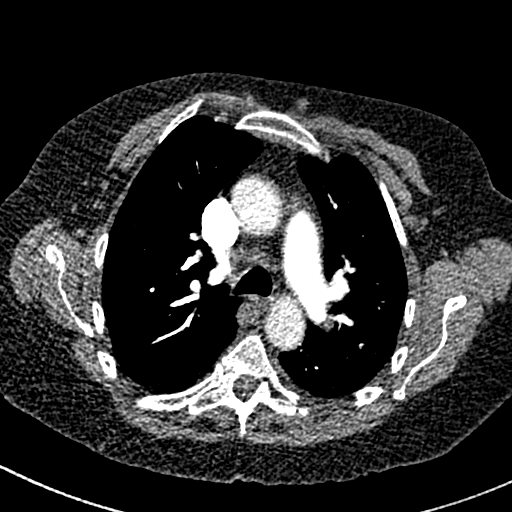
[im 159/227  lung]
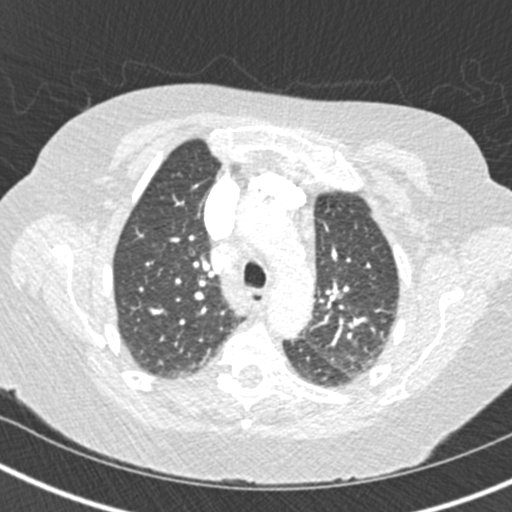
[im 181/227  mediastinal]
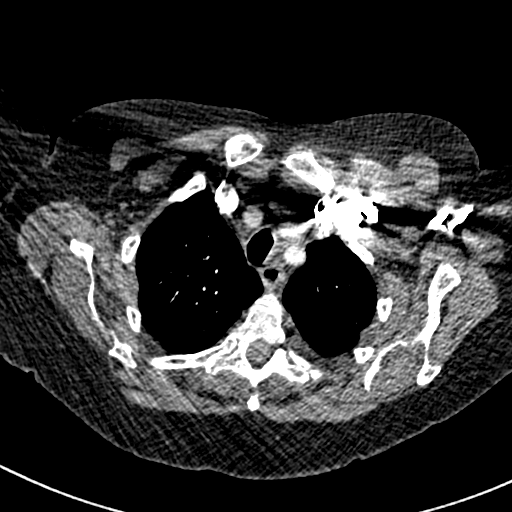
[im 193/227  lung]
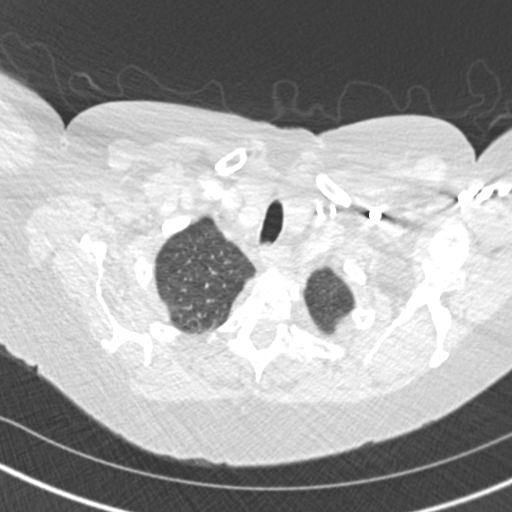
[im 204/227  mediastinal]
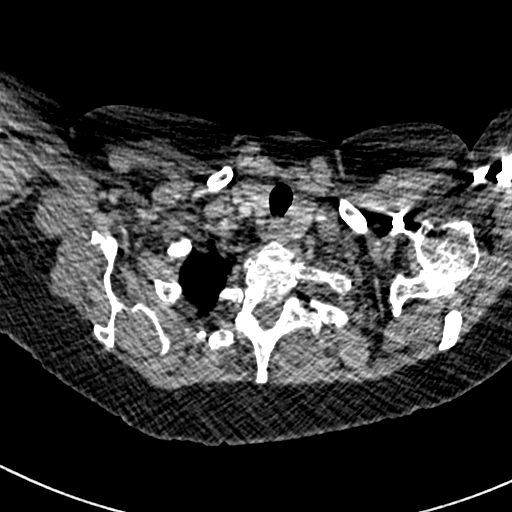
[im 215/227  lung]
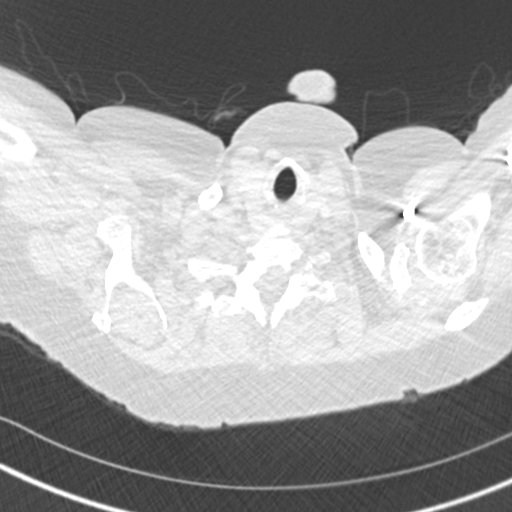

[18 of 36 positions shown; findings below may reference images not displayed]

FINDINGS: CARDIOVASCULAR: Adequate contrast opacification of the pulmonary
artery's. Main pulmonary artery is not enlarged. No pulmonary
arterial filling defects to the level of the subsegmental branches.
Heart size is normal, no right heart strain. Mild coronary artery
calcifications. No pericardial effusions. Thoracic aorta is normal
course and caliber, mild calcific atherosclerosis of aortic arch.

MEDIASTINUM/NODES: No lymphadenopathy by CT size criteria.

LUNGS/PLEURA: Tracheobronchial tree is patent, no pneumothorax. 4 mm
ground-glass nodule RIGHT upper lobe is below size followup
recommendation. Bibasilar dependent atelectasis.

UPPER ABDOMEN: Large hiatal hernia.

MUSCULOSKELETAL: Visualized soft tissues and included osseous
structures are nonacute. Exaggerated kyphosis. No acute osseous
process. Mild degenerative change of the thoracic spine. Probable
old pin track LEFT proximal humerus.

Review of the MIP images confirms the above findings.
IMPRESSION: No acute cardiopulmonary process or acute pulmonary embolism.

Stable large hiatal hernia.

## 2016-12-04 ENCOUNTER — Other Ambulatory Visit
Admission: RE | Admit: 2016-12-04 | Discharge: 2016-12-04 | Disposition: A | Discharge status: Home / Self Care | Payer: Managed Care, Other (non HMO) | Source: Other Acute Inpatient Hospital | Attending: Internal Medicine | Admitting: Internal Medicine

## 2016-12-04 DIAGNOSIS — R509 Fever, unspecified: Secondary | ICD-10-CM | POA: Insufficient documentation

## 2016-12-04 LAB — COMPREHENSIVE METABOLIC PANEL
ALBUMIN: 3.3 g/dL — AB (ref 3.5–5.0)
ALK PHOS: 111 U/L (ref 38–126)
AST: 27 U/L (ref 15–41)
Anion gap: 10 (ref 5–15)
BUN: 27 mg/dL — AB (ref 6–20)
CALCIUM: 8.2 mg/dL — AB (ref 8.9–10.3)
CHLORIDE: 115 mmol/L — AB (ref 101–111)
CO2: 13 mmol/L — AB (ref 22–32)
CREATININE: 1.53 mg/dL — AB (ref 0.44–1.00)
GFR calc Af Amer: 39 mL/min — ABNORMAL LOW (ref >60)
GFR calc non Af Amer: 34 mL/min — ABNORMAL LOW (ref >60)
GLUCOSE: 90 mg/dL (ref 65–99)
Potassium: 5.9 mmol/L — ABNORMAL HIGH (ref 3.5–5.1)
SODIUM: 138 mmol/L (ref 135–145)
Total Bilirubin: 0.9 mg/dL (ref 0.3–1.2)
Total Protein: 5.9 g/dL — ABNORMAL LOW (ref 6.5–8.1)

## 2016-12-04 LAB — LIPASE, BLOOD: Lipase: 56 U/L — ABNORMAL HIGH (ref 11–51)

## 2016-12-04 LAB — AMYLASE: AMYLASE: 71 U/L (ref 28–100)

## 2017-02-27 ENCOUNTER — Emergency Department (HOSPITAL_BASED_OUTPATIENT_CLINIC_OR_DEPARTMENT_OTHER): Payer: Medicare Other

## 2017-02-27 ENCOUNTER — Encounter (HOSPITAL_BASED_OUTPATIENT_CLINIC_OR_DEPARTMENT_OTHER): Payer: Self-pay | Admitting: *Deleted

## 2017-02-27 ENCOUNTER — Inpatient Hospital Stay (HOSPITAL_BASED_OUTPATIENT_CLINIC_OR_DEPARTMENT_OTHER)
Admission: EM | Admit: 2017-02-27 | Discharge: 2017-03-01 | DRG: 419 | Disposition: A | Discharge status: Home / Self Care | Payer: Medicare Other | Attending: Surgery | Admitting: Surgery

## 2017-02-27 DIAGNOSIS — Z7951 Long term (current) use of inhaled steroids: Secondary | ICD-10-CM

## 2017-02-27 DIAGNOSIS — Z79899 Other long term (current) drug therapy: Secondary | ICD-10-CM

## 2017-02-27 DIAGNOSIS — F419 Anxiety disorder, unspecified: Secondary | ICD-10-CM | POA: Diagnosis present

## 2017-02-27 DIAGNOSIS — Z886 Allergy status to analgesic agent status: Secondary | ICD-10-CM

## 2017-02-27 DIAGNOSIS — K810 Acute cholecystitis: Secondary | ICD-10-CM | POA: Diagnosis present

## 2017-02-27 DIAGNOSIS — Z882 Allergy status to sulfonamides status: Secondary | ICD-10-CM

## 2017-02-27 DIAGNOSIS — R1011 Right upper quadrant pain: Secondary | ICD-10-CM

## 2017-02-27 DIAGNOSIS — F329 Major depressive disorder, single episode, unspecified: Secondary | ICD-10-CM | POA: Diagnosis present

## 2017-02-27 DIAGNOSIS — Z96652 Presence of left artificial knee joint: Secondary | ICD-10-CM | POA: Diagnosis present

## 2017-02-27 DIAGNOSIS — N189 Chronic kidney disease, unspecified: Secondary | ICD-10-CM | POA: Diagnosis present

## 2017-02-27 DIAGNOSIS — Z880 Allergy status to penicillin: Secondary | ICD-10-CM

## 2017-02-27 DIAGNOSIS — Z888 Allergy status to other drugs, medicaments and biological substances status: Secondary | ICD-10-CM

## 2017-02-27 DIAGNOSIS — M1990 Unspecified osteoarthritis, unspecified site: Secondary | ICD-10-CM | POA: Diagnosis present

## 2017-02-27 DIAGNOSIS — K219 Gastro-esophageal reflux disease without esophagitis: Secondary | ICD-10-CM | POA: Diagnosis present

## 2017-02-27 DIAGNOSIS — K2270 Barrett's esophagus without dysplasia: Secondary | ICD-10-CM | POA: Diagnosis present

## 2017-02-27 DIAGNOSIS — G43909 Migraine, unspecified, not intractable, without status migrainosus: Secondary | ICD-10-CM | POA: Diagnosis present

## 2017-02-27 DIAGNOSIS — E0590 Thyrotoxicosis, unspecified without thyrotoxic crisis or storm: Secondary | ICD-10-CM | POA: Diagnosis present

## 2017-02-27 DIAGNOSIS — I129 Hypertensive chronic kidney disease with stage 1 through stage 4 chronic kidney disease, or unspecified chronic kidney disease: Secondary | ICD-10-CM | POA: Diagnosis present

## 2017-02-27 DIAGNOSIS — J45909 Unspecified asthma, uncomplicated: Secondary | ICD-10-CM | POA: Diagnosis present

## 2017-02-27 LAB — CBC
HCT: 40.3 % (ref 36.0–46.0)
Hemoglobin: 13.2 g/dL (ref 12.0–15.0)
MCH: 29.6 pg (ref 26.0–34.0)
MCHC: 32.8 g/dL (ref 30.0–36.0)
MCV: 90.4 fL (ref 78.0–100.0)
PLATELETS: 263 10*3/uL (ref 150–400)
RBC: 4.46 MIL/uL (ref 3.87–5.11)
RDW: 13.3 % (ref 11.5–15.5)
WBC: 11.4 10*3/uL — AB (ref 4.0–10.5)

## 2017-02-27 LAB — COMPREHENSIVE METABOLIC PANEL
ALK PHOS: 111 U/L (ref 38–126)
ALT: 14 U/L (ref 14–54)
ANION GAP: 8 (ref 5–15)
AST: 18 U/L (ref 15–41)
Albumin: 3.7 g/dL (ref 3.5–5.0)
BILIRUBIN TOTAL: 0.1 mg/dL — AB (ref 0.3–1.2)
BUN: 27 mg/dL — ABNORMAL HIGH (ref 6–20)
CALCIUM: 8.8 mg/dL — AB (ref 8.9–10.3)
CO2: 21 mmol/L — ABNORMAL LOW (ref 22–32)
Chloride: 110 mmol/L (ref 101–111)
Creatinine, Ser: 1.63 mg/dL — ABNORMAL HIGH (ref 0.44–1.00)
GFR calc non Af Amer: 32 mL/min — ABNORMAL LOW (ref >60)
GFR, EST AFRICAN AMERICAN: 37 mL/min — AB (ref >60)
Glucose, Bld: 130 mg/dL — ABNORMAL HIGH (ref 65–99)
POTASSIUM: 4 mmol/L (ref 3.5–5.1)
Sodium: 139 mmol/L (ref 135–145)
TOTAL PROTEIN: 7.1 g/dL (ref 6.5–8.1)

## 2017-02-27 LAB — LIPASE, BLOOD: Lipase: 32 U/L (ref 11–51)

## 2017-02-27 MED ORDER — ONDANSETRON HCL 4 MG/2ML IJ SOLN
4.0000 mg | Freq: Once | INTRAMUSCULAR | Status: AC
  Administered 2017-02-27: 4 mg via INTRAVENOUS
  Filled 2017-02-27: qty 2

## 2017-02-27 MED ORDER — FENTANYL CITRATE (PF) 100 MCG/2ML IJ SOLN
50.0000 ug | Freq: Once | INTRAMUSCULAR | Status: AC
  Administered 2017-02-28: 50 ug via INTRAVENOUS
  Filled 2017-02-27: qty 2

## 2017-02-27 MED ORDER — FENTANYL CITRATE (PF) 100 MCG/2ML IJ SOLN
100.0000 ug | Freq: Once | INTRAMUSCULAR | Status: AC
  Administered 2017-02-27: 100 ug via INTRAVENOUS
  Filled 2017-02-27: qty 2

## 2017-02-27 NOTE — ED Notes (Signed)
Patient transported to Ultrasound 

## 2017-02-27 NOTE — ED Provider Notes (Signed)
[facility] Provider Note: Melinda DellJ. Lane Awais Cobarrubias, MD, FACEP  CSN: [id number] MRN: [id number] ARRIVAL: 02/27/17 at 2140 ROOM: [facility]   CHIEF COMPLAINT  Abdominal Pain   HISTORY OF PRESENT ILLNESS  Melinda Berger is a 66 y.o. female who was recently treated for pneumonia. She is here with right upper quadrant pain that began this morning about 9 AM. The onset was fairly sudden. The pain has been constant and severe. She describes it as feeling like she is being stabbed. The pain radiates into her back. Pain is worse with movement or palpation. There has been no associated nausea, vomiting, diarrhea or constipation. She was noted to have a low-grade fever of 99.1 on arrival.   Past Medical History:  Diagnosis Date  . Anxiety   . Arthritis   . Asthma   . Barrett esophagus   . Chronic kidney disease   . Depression   . GERD (gastroesophageal reflux disease)   . Heart murmur   . History of hiatal hernia   . Hypertension   . Migraines   . Pneumonia 06/2015  . Thyroid disease     Past Surgical History:  Procedure Laterality Date  . EYE SURGERY     bilateral cataract removal  . I&D EXTREMITY  10/07/2011   Procedure: IRRIGATION AND DEBRIDEMENT EXTREMITY;  Surgeon: Eldred MangesMark C Yates;  Location: [facility];  Service: Orthopedics;  Laterality: Right;  . JOINT REPLACEMENT     Left Knee Replacement  . ORIF ULNAR FRACTURE  10/07/2011   Procedure: OPEN REDUCTION INTERNAL FIXATION (ORIF) ULNAR FRACTURE;  Surgeon: Eldred MangesMark C Yates;  Location: [facility];  Service: Orthopedics;  Laterality: Right;  . REPLACEMENT TOTAL KNEE Left   . ROTATOR CUFF REPAIR    . TONSILLECTOMY      History reviewed. No pertinent family history.  Social History  Substance Use Topics  . Smoking status: Never Smoker  . Smokeless tobacco: Never Used  . Alcohol use No    Prior to Admission medications   Medication Sig Start Date End Date Taking? Authorizing Provider  mirtazapine (REMERON SOL-TAB) 45 MG disintegrating  tablet Take 45 mg by mouth at bedtime.   Yes [provider]  albuterol (PROVENTIL) (2.5 MG/3ML) 0.083% nebulizer solution Inhale 3 mLs into the lungs daily as needed. For shortness of breath 10/18/10   [provider]  betamethasone dipropionate (DIPROLENE) 0.05 % cream Apply 1 application topically daily as needed. For leg irritation 05/30/15   [provider]  butalbital-acetaminophen-caffeine (FIORICET, ESGIC) 50-325-40 MG tablet Take 1 tablet by mouth 2 (two) times daily as needed for headache.    [provider]  calcium-vitamin D (OSCAL WITH D) 500-200 MG-UNIT tablet Take 1 tablet by mouth 2 (two) times daily. 07/20/15   Hongalgi, Maximino GreenlandAnand D, MD  CARAFATE 1 GM/10ML suspension Take 10 mLs by mouth 4 (four) times daily as needed (for heartburn).  06/30/15   [provider]  cyclobenzaprine (FLEXERIL) 10 MG tablet Take 10 mg by mouth 3 (three) times daily as needed for muscle spasms.    [provider]  fluticasone (FLONASE) 50 MCG/ACT nasal spray Place 2 sprays into both nostrils daily.    [provider]  Fluticasone-Salmeterol (ADVAIR) 250-50 MCG/DOSE AEPB Inhale 1 puff into the lungs 2 (two) times daily.    [provider]  LORazepam (ATIVAN) 0.5 MG tablet Take 0.5 mg by mouth every 6 (six) hours as needed. For anxiety     [provider]  montelukast (SINGULAIR) 10  MG tablet Take 10 mg by mouth at bedtime.    [provider]  oxyCODONE (OXY IR/ROXICODONE) 5 MG immediate release tablet Take 10 mg by mouth every 4 (four) hours as needed for severe pain.     [provider]  PROLIA 60 MG/ML SOLN injection Inject 60 mLs into the muscle See admin instructions. Takes injections twice a year 04/28/15   [provider]  RABEprazole (ACIPHEX) 20 MG tablet Take 20 mg by mouth daily.     [provider]  temazepam (RESTORIL) 30 MG capsule Take 30 mg by mouth at bedtime as needed for sleep.     [provider]  topiramate (TOPAMAX) 100 MG tablet Take 100 mg by mouth at bedtime.     [provider]    Allergies Amoxicillin-pot clavulanate; Nsaids; Sulfa antibiotics; and Zolpidem tartrate   REVIEW OF SYSTEMS  Negative except as noted here or in the History of Present Illness.   PHYSICAL EXAMINATION  Initial Vital Signs Blood pressure (!) 153/66, pulse 93, temperature 99.1 F (37.3 C), temperature source Oral, resp. rate 20, height 5' (1.524 m), weight 88 kg (194 lb), SpO2 100 %.  Examination General: Well-developed, well-nourished female in no acute distress; appearance consistent with age of record HENT: normocephalic; atraumatic Eyes: pupils equal, round and reactive to light; extraocular muscles intact; lens implants Neck: supple Heart: regular rate and rhythm Lungs: clear to auscultation bilaterally Abdomen: soft; nondistended; right upper quadrant tenderness with positive Murphy sign; no masses or hepatosplenomegaly; bowel sounds present GU: No CVA tenderness Extremities: No deformity; full range of motion; pulses normal Neurologic: Awake, alert and oriented; motor function intact in all extremities and symmetric; no facial droop Skin: Warm and dry Psychiatric: Flat affect   RESULTS  Summary of this visit's results, reviewed by myself:   EKG Interpretation  Date/Time:    Ventricular Rate:    PR Interval:    QRS Duration:   QT Interval:    QTC Calculation:   R Axis:     Text Interpretation:        Laboratory Studies: Results for orders placed or performed during the hospital encounter of 02/27/17 (from the past 24 hour(s))  Lipase, blood     Status: None   Collection Time: 02/27/17 10:42 PM  Result Value Ref Range   Lipase 32 11 - 51 U/L  Comprehensive metabolic panel     Status: Abnormal   Collection Time: 02/27/17 10:42 PM  Result Value Ref Range   Sodium 139 135 - 145 mmol/L   Potassium 4.0 3.5 - 5.1 mmol/L   Chloride 110 101  - 111 mmol/L   CO2 21 (L) 22 - 32 mmol/L   Glucose, Bld 130 (H) 65 - 99 mg/dL   BUN 27 (H) 6 - 20 mg/dL   Creatinine, Ser 1.61 (H) 0.44 - 1.00 mg/dL   Calcium 8.8 (L) 8.9 - 10.3 mg/dL   Total Protein 7.1 6.5 - 8.1 g/dL   Albumin 3.7 3.5 - 5.0 g/dL   AST 18 15 - 41 U/L   ALT 14 14 - 54 U/L   Alkaline Phosphatase 111 38 - 126 U/L   Total Bilirubin 0.1 (L) 0.3 - 1.2 mg/dL   GFR calc non Af Amer 32 (L) >60 mL/min   GFR calc Af Amer 37 (L) >60 mL/min   Anion gap 8 5 - 15  CBC     Status: Abnormal   Collection Time: 02/27/17 10:42 PM  Result Value Ref  Range   WBC 11.4 (H) 4.0 - 10.5 K/uL   RBC 4.46 3.87 - 5.11 MIL/uL   Hemoglobin 13.2 12.0 - 15.0 g/dL   HCT 91.4 78.2 - 95.6 %   MCV 90.4 78.0 - 100.0 fL   MCH 29.6 26.0 - 34.0 pg   MCHC 32.8 30.0 - 36.0 g/dL   RDW 21.3 08.6 - 57.8 %   Platelets 263 150 - 400 K/uL  Differential     Status: Abnormal   Collection Time: 02/27/17 10:42 PM  Result Value Ref Range   Neutrophils Relative % 69 %   Neutro Abs 7.9 (H) 1.7 - 7.7 K/uL   Lymphocytes Relative 19 %   Lymphs Abs 2.2 0.7 - 4.0 K/uL   Monocytes Relative 10 %   Monocytes Absolute 1.2 (H) 0.1 - 1.0 K/uL   Eosinophils Relative 2 %   Eosinophils Absolute 0.3 0.0 - 0.7 K/uL   Basophils Relative 0 %   Basophils Absolute 0.0 0.0 - 0.1 K/uL   Imaging Studies: US Abdomen Limited Ruq  Result Date: 02/28/2017 CLINICAL DATA:  Acute onset of right upper quadrant abdominal pain. Leukocytosis and fever. Initial encounter. EXAM: US ABDOMEN LIMITED - RIGHT UPPER QUADRANT COMPARISON:  None. FINDINGS: Gallbladder: Mild gallbladder wall thickening is noted, with trace pericholecystic fluid. Stones measure up to 1.2 cm in size. A Murphy's sign was elicited by the ordering physician. Evaluation for ultrasonographic Murphy's sign is limited as the patient is on pain medication. Common bile duct: Diameter: 0.6 cm, within normal limits in caliber. Liver: No focal lesion identified. Within normal limits in  parenchymal echogenicity. IMPRESSION: Mild gallbladder wall thickening, with trace pericholecystic fluid and underlying stones. This is concerning for acute cholecystitis. Electronically Signed   By: Roanna Raider M.D.   On: 02/28/2017 00:09    ED COURSE  Nursing notes and initial vitals signs, including pulse oximetry, reviewed.  Vitals:   02/27/17 2145 02/27/17 2146 02/27/17 2352  BP: (!) 153/66  139/77  Pulse: 93  73  Resp: 20  18  Temp: 99.1 F (37.3 C)    TempSrc: Oral    SpO2: 100%  100%  Weight:  88 kg (194 lb)   Height:  5' (1.524 m)    1:03 AM Rocephin ordered for acute cholecystitis. Dr. Luisa Hart accepts for transfer to [facility] ED for evaluation.  PROCEDURES    ED DIAGNOSES     ICD-9-CM ICD-10-CM   1. Acute cholecystitis 575.0 K81.0   2. RUQ abdominal pain 789.01 R10.11 US Abdomen Limited RUQ     US Abdomen Limited RUQ       Eural Holzschuh, MD 02/28/17 0110

## 2017-02-27 NOTE — ED Triage Notes (Signed)
Pt reports being treated for PNA.  States upper right GI pain that started today.  Denies N/V/D.  States that she has taken multiple medications without relief.  Reports that if she does not move the pain is not bad, the pain is worse with movement.

## 2017-02-28 ENCOUNTER — Inpatient Hospital Stay (HOSPITAL_COMMUNITY): Payer: Medicare Other | Admitting: Anesthesiology

## 2017-02-28 ENCOUNTER — Encounter (HOSPITAL_COMMUNITY): Admission: EM | Disposition: A | Discharge status: Home / Self Care | Payer: Self-pay | Source: Home / Self Care

## 2017-02-28 DIAGNOSIS — E0590 Thyrotoxicosis, unspecified without thyrotoxic crisis or storm: Secondary | ICD-10-CM | POA: Diagnosis present

## 2017-02-28 DIAGNOSIS — K2270 Barrett's esophagus without dysplasia: Secondary | ICD-10-CM | POA: Diagnosis present

## 2017-02-28 DIAGNOSIS — J45909 Unspecified asthma, uncomplicated: Secondary | ICD-10-CM | POA: Diagnosis present

## 2017-02-28 DIAGNOSIS — N189 Chronic kidney disease, unspecified: Secondary | ICD-10-CM | POA: Diagnosis present

## 2017-02-28 DIAGNOSIS — Z886 Allergy status to analgesic agent status: Secondary | ICD-10-CM | POA: Diagnosis not present

## 2017-02-28 DIAGNOSIS — Z880 Allergy status to penicillin: Secondary | ICD-10-CM | POA: Diagnosis not present

## 2017-02-28 DIAGNOSIS — M1990 Unspecified osteoarthritis, unspecified site: Secondary | ICD-10-CM | POA: Diagnosis present

## 2017-02-28 DIAGNOSIS — Z882 Allergy status to sulfonamides status: Secondary | ICD-10-CM | POA: Diagnosis not present

## 2017-02-28 DIAGNOSIS — Z7951 Long term (current) use of inhaled steroids: Secondary | ICD-10-CM | POA: Diagnosis not present

## 2017-02-28 DIAGNOSIS — Z888 Allergy status to other drugs, medicaments and biological substances status: Secondary | ICD-10-CM | POA: Diagnosis not present

## 2017-02-28 DIAGNOSIS — Z79899 Other long term (current) drug therapy: Secondary | ICD-10-CM | POA: Diagnosis not present

## 2017-02-28 DIAGNOSIS — Z96652 Presence of left artificial knee joint: Secondary | ICD-10-CM | POA: Diagnosis present

## 2017-02-28 DIAGNOSIS — K810 Acute cholecystitis: Secondary | ICD-10-CM | POA: Diagnosis present

## 2017-02-28 DIAGNOSIS — K219 Gastro-esophageal reflux disease without esophagitis: Secondary | ICD-10-CM | POA: Diagnosis present

## 2017-02-28 DIAGNOSIS — I129 Hypertensive chronic kidney disease with stage 1 through stage 4 chronic kidney disease, or unspecified chronic kidney disease: Secondary | ICD-10-CM | POA: Diagnosis present

## 2017-02-28 DIAGNOSIS — F419 Anxiety disorder, unspecified: Secondary | ICD-10-CM | POA: Diagnosis present

## 2017-02-28 DIAGNOSIS — G43909 Migraine, unspecified, not intractable, without status migrainosus: Secondary | ICD-10-CM | POA: Diagnosis present

## 2017-02-28 DIAGNOSIS — F329 Major depressive disorder, single episode, unspecified: Secondary | ICD-10-CM | POA: Diagnosis present

## 2017-02-28 HISTORY — PX: CHOLECYSTECTOMY: SHX55

## 2017-02-28 LAB — DIFFERENTIAL
BASOS PCT: 0 %
Basophils Absolute: 0 10*3/uL (ref 0.0–0.1)
EOS PCT: 2 %
Eosinophils Absolute: 0.3 10*3/uL (ref 0.0–0.7)
Lymphocytes Relative: 19 %
Lymphs Abs: 2.2 10*3/uL (ref 0.7–4.0)
Monocytes Absolute: 1.2 10*3/uL — ABNORMAL HIGH (ref 0.1–1.0)
Monocytes Relative: 10 %
NEUTROS PCT: 69 %
Neutro Abs: 7.9 10*3/uL — ABNORMAL HIGH (ref 1.7–7.7)

## 2017-02-28 LAB — URINALYSIS, ROUTINE W REFLEX MICROSCOPIC
Bilirubin Urine: NEGATIVE
Glucose, UA: NEGATIVE mg/dL
HGB URINE DIPSTICK: NEGATIVE
KETONES UR: NEGATIVE mg/dL
Nitrite: NEGATIVE
PROTEIN: NEGATIVE mg/dL
Specific Gravity, Urine: 1.025 (ref 1.005–1.030)
pH: 5 (ref 5.0–8.0)

## 2017-02-28 LAB — SURGICAL PCR SCREEN
MRSA, PCR: POSITIVE — AB
Staphylococcus aureus: POSITIVE — AB

## 2017-02-28 LAB — URINALYSIS, MICROSCOPIC (REFLEX)

## 2017-02-28 SURGERY — LAPAROSCOPIC CHOLECYSTECTOMY WITH INTRAOPERATIVE CHOLANGIOGRAM
Anesthesia: General | Site: Abdomen

## 2017-02-28 MED ORDER — MORPHINE SULFATE (PF) 4 MG/ML IV SOLN: 2.0000 mg | INTRAVENOUS | Status: DC | PRN

## 2017-02-28 MED ORDER — LACTATED RINGERS IR SOLN
Status: DC | PRN
  Administered 2017-02-28: 3000 mL

## 2017-02-28 MED ORDER — ROCURONIUM BROMIDE 10 MG/ML (PF) SYRINGE
PREFILLED_SYRINGE | INTRAVENOUS | Status: DC | PRN
  Administered 2017-02-28: 40 mg via INTRAVENOUS

## 2017-02-28 MED ORDER — PROMETHAZINE HCL 25 MG/ML IJ SOLN: 6.2500 mg | INTRAMUSCULAR | Status: DC | PRN

## 2017-02-28 MED ORDER — HYDROMORPHONE HCL 1 MG/ML IJ SOLN
INTRAMUSCULAR | Status: AC
  Filled 2017-02-28: qty 0.5

## 2017-02-28 MED ORDER — PROPOFOL 10 MG/ML IV BOLUS
INTRAVENOUS | Status: AC
  Filled 2017-02-28: qty 20

## 2017-02-28 MED ORDER — DOCUSATE SODIUM 100 MG PO CAPS
100.0000 mg | ORAL_CAPSULE | Freq: Two times a day (BID) | ORAL | Status: DC
  Administered 2017-02-28 – 2017-03-01 (×2): 100 mg via ORAL
  Filled 2017-02-28 (×2): qty 1

## 2017-02-28 MED ORDER — SUCCINYLCHOLINE CHLORIDE 200 MG/10ML IV SOSY
PREFILLED_SYRINGE | INTRAVENOUS | Status: AC
  Filled 2017-02-28: qty 10

## 2017-02-28 MED ORDER — LIDOCAINE 2% (20 MG/ML) 5 ML SYRINGE
INTRAMUSCULAR | Status: AC
  Filled 2017-02-28: qty 5

## 2017-02-28 MED ORDER — CYCLOBENZAPRINE HCL 10 MG PO TABS
10.0000 mg | ORAL_TABLET | Freq: Three times a day (TID) | ORAL | Status: DC | PRN
  Administered 2017-02-28 – 2017-03-01 (×2): 10 mg via ORAL
  Filled 2017-02-28 (×2): qty 1

## 2017-02-28 MED ORDER — SUGAMMADEX SODIUM 200 MG/2ML IV SOLN
INTRAVENOUS | Status: DC | PRN
  Administered 2017-02-28: 200 mg via INTRAVENOUS

## 2017-02-28 MED ORDER — LIDOCAINE 2% (20 MG/ML) 5 ML SYRINGE
INTRAMUSCULAR | Status: DC | PRN
  Administered 2017-02-28: 100 mg via INTRAVENOUS

## 2017-02-28 MED ORDER — BUPIVACAINE HCL (PF) 0.25 % IJ SOLN
INTRAMUSCULAR | Status: DC | PRN
  Administered 2017-02-28: 30 mL

## 2017-02-28 MED ORDER — HYDROMORPHONE HCL 1 MG/ML IJ SOLN
INTRAMUSCULAR | Status: AC
  Filled 2017-02-28: qty 1

## 2017-02-28 MED ORDER — LACTATED RINGERS IV SOLN
INTRAVENOUS | Status: DC | PRN
  Administered 2017-02-28 (×2): via INTRAVENOUS

## 2017-02-28 MED ORDER — LACTATED RINGERS IV SOLN
INTRAVENOUS | Status: DC
  Administered 2017-02-28: 12:00:00 via INTRAVENOUS

## 2017-02-28 MED ORDER — MIDAZOLAM HCL 5 MG/5ML IJ SOLN
INTRAMUSCULAR | Status: DC | PRN
  Administered 2017-02-28: 2 mg via INTRAVENOUS

## 2017-02-28 MED ORDER — SUCCINYLCHOLINE CHLORIDE 200 MG/10ML IV SOSY
PREFILLED_SYRINGE | INTRAVENOUS | Status: DC | PRN
  Administered 2017-02-28: 120 mg via INTRAVENOUS

## 2017-02-28 MED ORDER — ROCURONIUM BROMIDE 50 MG/5ML IV SOSY
PREFILLED_SYRINGE | INTRAVENOUS | Status: AC
  Filled 2017-02-28: qty 5

## 2017-02-28 MED ORDER — HYDROMORPHONE HCL 1 MG/ML IJ SOLN
1.0000 mg | INTRAMUSCULAR | Status: DC | PRN
  Administered 2017-02-28 (×3): 1 mg via INTRAVENOUS
  Filled 2017-02-28 (×3): qty 1

## 2017-02-28 MED ORDER — FENTANYL CITRATE (PF) 100 MCG/2ML IJ SOLN
INTRAMUSCULAR | Status: DC | PRN
  Administered 2017-02-28 (×2): 50 ug via INTRAVENOUS

## 2017-02-28 MED ORDER — VANCOMYCIN HCL IN DEXTROSE 1-5 GM/200ML-% IV SOLN
INTRAVENOUS | Status: AC
  Administered 2017-02-28: 1000 mg via INTRAVENOUS
  Filled 2017-02-28: qty 200

## 2017-02-28 MED ORDER — PROPOFOL 10 MG/ML IV BOLUS
INTRAVENOUS | Status: DC | PRN
  Administered 2017-02-28: 150 mg via INTRAVENOUS

## 2017-02-28 MED ORDER — ONDANSETRON HCL 4 MG/2ML IJ SOLN: 4.0000 mg | Freq: Four times a day (QID) | INTRAMUSCULAR | Status: DC | PRN

## 2017-02-28 MED ORDER — ONDANSETRON 4 MG PO TBDP: 4.0000 mg | ORAL_TABLET | Freq: Four times a day (QID) | ORAL | Status: DC | PRN

## 2017-02-28 MED ORDER — LORAZEPAM 0.5 MG PO TABS: 0.5000 mg | ORAL_TABLET | Freq: Four times a day (QID) | ORAL | Status: DC | PRN

## 2017-02-28 MED ORDER — DEXTROSE 5 % IV SOLN
2.0000 g | Freq: Once | INTRAVENOUS | Status: AC
  Administered 2017-02-28: 2 g via INTRAVENOUS
  Filled 2017-02-28: qty 2

## 2017-02-28 MED ORDER — HYDROMORPHONE HCL 1 MG/ML IJ SOLN
0.2500 mg | INTRAMUSCULAR | Status: DC | PRN
  Administered 2017-02-28 (×3): 0.5 mg via INTRAVENOUS

## 2017-02-28 MED ORDER — DEXTROSE-NACL 5-0.9 % IV SOLN
INTRAVENOUS | Status: DC
  Administered 2017-02-28: 05:00:00 via INTRAVENOUS

## 2017-02-28 MED ORDER — ALBUTEROL SULFATE (2.5 MG/3ML) 0.083% IN NEBU: 3.0000 mL | INHALATION_SOLUTION | Freq: Four times a day (QID) | RESPIRATORY_TRACT | Status: DC | PRN

## 2017-02-28 MED ORDER — TOPIRAMATE 100 MG PO TABS
100.0000 mg | ORAL_TABLET | Freq: Every day | ORAL | Status: DC
  Administered 2017-02-28: 100 mg via ORAL
  Filled 2017-02-28: qty 1

## 2017-02-28 MED ORDER — ENOXAPARIN SODIUM 40 MG/0.4ML ~~LOC~~ SOLN: 40.0000 mg | SUBCUTANEOUS | Status: DC

## 2017-02-28 MED ORDER — BUPIVACAINE HCL (PF) 0.25 % IJ SOLN
INTRAMUSCULAR | Status: AC
  Filled 2017-02-28: qty 30

## 2017-02-28 MED ORDER — ACETAMINOPHEN 325 MG PO TABS: 650.0000 mg | ORAL_TABLET | Freq: Four times a day (QID) | ORAL | Status: DC | PRN

## 2017-02-28 MED ORDER — DEXAMETHASONE SODIUM PHOSPHATE 10 MG/ML IJ SOLN
INTRAMUSCULAR | Status: DC | PRN
  Administered 2017-02-28: 10 mg via INTRAVENOUS

## 2017-02-28 MED ORDER — FENTANYL CITRATE (PF) 100 MCG/2ML IJ SOLN
INTRAMUSCULAR | Status: AC
  Filled 2017-02-28: qty 2

## 2017-02-28 MED ORDER — VANCOMYCIN HCL IN DEXTROSE 1-5 GM/200ML-% IV SOLN
1000.0000 mg | Freq: Once | INTRAVENOUS | Status: AC
  Administered 2017-02-28: 1000 mg via INTRAVENOUS

## 2017-02-28 MED ORDER — MIRTAZAPINE 15 MG PO TABS
45.0000 mg | ORAL_TABLET | Freq: Every day | ORAL | Status: DC
  Administered 2017-02-28: 45 mg via ORAL
  Filled 2017-02-28: qty 3

## 2017-02-28 MED ORDER — 0.9 % SODIUM CHLORIDE (POUR BTL) OPTIME
TOPICAL | Status: DC | PRN
  Administered 2017-02-28: 1000 mL

## 2017-02-28 MED ORDER — TEMAZEPAM 15 MG PO CAPS
30.0000 mg | ORAL_CAPSULE | Freq: Every day | ORAL | Status: DC
  Administered 2017-02-28: 30 mg via ORAL
  Filled 2017-02-28: qty 2

## 2017-02-28 MED ORDER — ONDANSETRON HCL 4 MG/2ML IJ SOLN
INTRAMUSCULAR | Status: DC | PRN
  Administered 2017-02-28: 4 mg via INTRAVENOUS

## 2017-02-28 MED ORDER — MORPHINE SULFATE (PF) 2 MG/ML IV SOLN
2.0000 mg | INTRAVENOUS | Status: DC | PRN
  Administered 2017-02-28: 2 mg via INTRAVENOUS
  Filled 2017-02-28: qty 1

## 2017-02-28 MED ORDER — MIDAZOLAM HCL 2 MG/2ML IJ SOLN
INTRAMUSCULAR | Status: AC
  Filled 2017-02-28: qty 2

## 2017-02-28 MED ORDER — OXYCODONE HCL 5 MG PO TABS
5.0000 mg | ORAL_TABLET | ORAL | Status: DC | PRN
  Administered 2017-02-28 – 2017-03-01 (×2): 5 mg via ORAL
  Filled 2017-02-28 (×2): qty 1

## 2017-02-28 SURGICAL SUPPLY — 31 items
APPLIER CLIP ROT 10 11.4 M/L (STAPLE) ×3
CABLE HIGH FREQUENCY MONO STRZ (ELECTRODE) ×3 IMPLANT
CHLORAPREP W/TINT 26ML (MISCELLANEOUS) ×3 IMPLANT
CLIP APPLIE ROT 10 11.4 M/L (STAPLE) ×1 IMPLANT
COVER MAYO STAND STRL (DRAPES) IMPLANT
COVER SURGICAL LIGHT HANDLE (MISCELLANEOUS) ×3 IMPLANT
DECANTER SPIKE VIAL GLASS SM (MISCELLANEOUS) ×3 IMPLANT
DERMABOND ADVANCED (GAUZE/BANDAGES/DRESSINGS) ×2
DERMABOND ADVANCED .7 DNX12 (GAUZE/BANDAGES/DRESSINGS) ×1 IMPLANT
DRAPE C-ARM 42X120 X-RAY (DRAPES) IMPLANT
ELECT REM PT RETURN 15FT ADLT (MISCELLANEOUS) ×3 IMPLANT
GLOVE BIO SURGEON STRL SZ 6 (GLOVE) ×3 IMPLANT
GLOVE INDICATOR 6.5 STRL GRN (GLOVE) ×3 IMPLANT
GOWN STRL REUS W/TWL LRG LVL3 (GOWN DISPOSABLE) ×3 IMPLANT
GOWN STRL REUS W/TWL XL LVL3 (GOWN DISPOSABLE) ×6 IMPLANT
GRASPER SUT TROCAR 14GX15 (MISCELLANEOUS) IMPLANT
HEMOSTAT SNOW SURGICEL 2X4 (HEMOSTASIS) IMPLANT
KIT BASIN OR (CUSTOM PROCEDURE TRAY) ×3 IMPLANT
NEEDLE INSUFFLATION 14GA 120MM (NEEDLE) ×3 IMPLANT
POUCH SPECIMEN RETRIEVAL 10MM (ENDOMECHANICALS) ×3 IMPLANT
SCISSORS LAP 5X35 DISP (ENDOMECHANICALS) ×3 IMPLANT
SET CHOLANGIOGRAPH MIX (MISCELLANEOUS) IMPLANT
SET IRRIG TUBING LAPAROSCOPIC (IRRIGATION / IRRIGATOR) ×3 IMPLANT
SLEEVE XCEL OPT CAN 5 100 (ENDOMECHANICALS) ×6 IMPLANT
SUT MNCRL AB 4-0 PS2 18 (SUTURE) ×3 IMPLANT
TOWEL OR 17X26 10 PK STRL BLUE (TOWEL DISPOSABLE) ×3 IMPLANT
TOWEL OR NON WOVEN STRL DISP B (DISPOSABLE) IMPLANT
TRAY LAPAROSCOPIC (CUSTOM PROCEDURE TRAY) ×3 IMPLANT
TROCAR BLADELESS OPT 5 100 (ENDOMECHANICALS) ×3 IMPLANT
TROCAR XCEL 12X100 BLDLESS (ENDOMECHANICALS) ×3 IMPLANT
TUBING INSUF HEATED (TUBING) ×3 IMPLANT

## 2017-02-28 NOTE — Op Note (Signed)
Operative Note  Dorian HeckleJannie Engdahl 66 y.o. female [id number]  02/28/2017  Surgeon: Berna Buehelsea A Eriverto Byrnes   Assistant: OR staff  Procedure performed: Laparoscopic Cholecystectomy  Preop diagnosis: acute cholecystitis Post-op diagnosis/intraop findings: same  Specimens: gallbladder  EBL: minimal  Complications: none  Description of procedure: After obtaining informed consent the patient was brought to the operating room. Prophylactic antibiotics and subcutaneous heparin were administered. SCD's were applied. General endotracheal anesthesia was initiated and a formal time-out was performed. The abdomen was prepped and draped in the usual sterile fashion and the abdomen was entered using an infraumbilical veress needle after instilling the site with local. Insufflation to 15mmHg was obtained, 5mm trocar and camera placed and gross inspection revealed no evidence of injury from our entry or other intraabdominal abnormalities. Two 5mm trocars were introduced in the right midclavicular and right anterior axillary lines under direct visualization and following infiltration with local. A 12mm trocar was placed in the epigastrium. The gallbladder was retracted cephalad and the infundibulum was retracted laterally. A combination of hook electrocautery and blunt dissection was utilized to clear the peritoneum and thin omental adhesions from the neck and cystic duct, circumferentially isolating the cystic artery and cystic duct and lifting the gallbladder from the cystic plate. The critical view of safety was achieved with the cystic artery, cystic duct, and liver bed visualized between them with no other structures. The artery was clipped with a single clip proximally and distally and divided as was the cystic duct with two clips on the proximal end. The gallbladder was dissected from the liver plate using electrocautery. Once freed the gallbladder was placed in an endocatch bag and removed intact through the  epigastric trocar site. A small amount of bleeding on the liver bed was controlled with cautery. The right upper quadrant was irrigated and the effluent was clear. Hemostasis was once again confirmed, and reinspection of the abdomen revealed no injuries. The clips were well opposed without any bile leak from the duct or the liver bed. The abdomen was desufflated and all trocars removed. The skin incisions were closed with running subcuticular monocryl and Dermabond. The patient was awakened, extubated and transported to the recovery room in stable condition.   All counts were correct at the completion of the case.

## 2017-02-28 NOTE — Anesthesia Preprocedure Evaluation (Signed)
Anesthesia Evaluation  Patient identified by MRN, date of birth, ID band Patient awake    Reviewed: Allergy & Precautions, NPO status , Patient's Chart, lab work & pertinent test results  Airway Mallampati: II  TM Distance: <3 FB Neck ROM: Full    Dental no notable dental hx.    Pulmonary asthma ,    Pulmonary exam normal breath sounds clear to auscultation       Cardiovascular hypertension, Normal cardiovascular exam Rhythm:Regular Rate:Normal     Neuro/Psych negative neurological ROS  negative psych ROS   GI/Hepatic Neg liver ROS, GERD  ,  Endo/Other  Hyperthyroidism   Renal/GU Renal InsufficiencyRenal disease  negative genitourinary   Musculoskeletal negative musculoskeletal ROS (+)   Abdominal   Peds negative pediatric ROS (+)  Hematology negative hematology ROS (+)   Anesthesia Other Findings   Reproductive/Obstetrics negative OB ROS                             Anesthesia Physical Anesthesia Plan  ASA: III  Anesthesia Plan: General   Post-op Pain Management:    Induction: Intravenous  Airway Management Planned: Oral ETT  Additional Equipment:   Intra-op Plan:   Post-operative Plan: Extubation in OR  Informed Consent: I have reviewed the patients History and Physical, chart, labs and discussed the procedure including the risks, benefits and alternatives for the proposed anesthesia with the patient or authorized representative who has indicated his/her understanding and acceptance.   Dental advisory given  Plan Discussed with: CRNA and Surgeon  Anesthesia Plan Comments:         Anesthesia Quick Evaluation

## 2017-02-28 NOTE — ED Notes (Signed)
Pt is alert and orineted x 4 and is verbally responsive. Pt reports RUQ pain 8/10. Denies N/V at this time. Pt dtr is at bedside.

## 2017-02-28 NOTE — Discharge Instructions (Signed)

## 2017-02-28 NOTE — Anesthesia Postprocedure Evaluation (Signed)
Anesthesia Post Note  Patient: Melinda HeckleJannie Berger  Procedure(s) Performed: Procedure(s) (LRB): LAPAROSCOPIC CHOLECYSTECTOMY (N/A)  Patient location during evaluation: PACU Anesthesia Type: General Level of consciousness: awake and alert Pain management: pain level controlled Vital Signs Assessment: post-procedure vital signs reviewed and stable Respiratory status: spontaneous breathing, nonlabored ventilation, respiratory function stable and patient connected to nasal cannula oxygen Cardiovascular status: blood pressure returned to baseline and stable Postop Assessment: no signs of nausea or vomiting Anesthetic complications: no       Last Vitals:  Vitals:   02/28/17 1530 02/28/17 1537  BP: (!) 164/72 (!) 154/70  Pulse: 71 76  Resp: 12 13  Temp:  36.4 C                  Shelton SilvasKevin D Hollis

## 2017-02-28 NOTE — H&P (Signed)
Melinda Berger is an 66 y.o. female.   Chief Complaint: abdominal pain HPI: asked to see patient at the request of Dr. Florina Ou of the emergency room. She was seen at the [facility] component emergency room for a 1 day history of epigastric and right upper quadrant abdominal pain. The pain came on yesterday. It progressed throughout the day. In the evening, she sought care at the emergency room at  [facility]. She was tender in her right upper quadrant and had an elevated white count. Ultrasound showed a thickened gallbladder wall with gallstones consistent with acute cholecystitis. She has never had pain like this before. She has not had any difficulty eating in the past but has some nausea and vomiting yesterday.  Past Medical History:  Diagnosis Date  . Anxiety   . Arthritis   . Asthma   . Barrett esophagus   . Chronic kidney disease   . Depression   . GERD (gastroesophageal reflux disease)   . Heart murmur   . History of hiatal hernia   . Hypertension   . Migraines   . Pneumonia 06/2015  . Thyroid disease     Past Surgical History:  Procedure Laterality Date  . EYE SURGERY     bilateral cataract removal  . I&D EXTREMITY  10/07/2011   Procedure: IRRIGATION AND DEBRIDEMENT EXTREMITY;  Surgeon: Marybelle Killings;  Location: [place];  Service: Orthopedics;  Laterality: Right;  . JOINT REPLACEMENT     Left Knee Replacement  . ORIF ULNAR FRACTURE  10/07/2011   Procedure: OPEN REDUCTION INTERNAL FIXATION (ORIF) ULNAR FRACTURE;  Surgeon: Marybelle Killings;  Location: [place];  Service: Orthopedics;  Laterality: Right;  . REPLACEMENT TOTAL KNEE Left   . ROTATOR CUFF REPAIR    . TONSILLECTOMY      History reviewed. No pertinent family history. Social History:  reports that she has never smoked. She has never used smokeless tobacco. She reports that she does not drink alcohol or use drugs.  Allergies:  Allergies  Allergen Reactions  . Amoxicillin-Pot Clavulanate Diarrhea    Very severe  diarrhea Has patient had a PCN reaction causing immediate rash, facial/tongue/throat swelling, SOB or lightheadedness with hypotension: no Has patient had a PCN reaction causing severe rash involving mucus membranes or skin necrosis:  no Has patient had a PCN reaction that required hospitalization: no Has patient had a PCN reaction occurring within the last 10 years:  no If all of the above answers are "NO", then may proceed with Cephalosporin use.   . Nsaids Other (See Comments)    Due to barret esophagus  . Sulfa Antibiotics Hives, Itching and Rash  . Zolpidem Tartrate Other (See Comments)    Hallucinations     Medications Prior to Admission  Medication Sig Dispense Refill  . albuterol (PROVENTIL) (2.5 MG/3ML) 0.083% nebulizer solution Inhale 3 mLs into the lungs daily as needed. For shortness of breath    . CARAFATE 1 GM/10ML suspension Take 10 mLs by mouth 4 (four) times daily as needed (for heartburn).   2  . cefdinir (OMNICEF) 300 MG capsule Take 300 mg by mouth 2 (two) times daily.    . cyclobenzaprine (FLEXERIL) 10 MG tablet Take 10 mg by mouth 3 (three) times daily as needed for muscle spasms.    . fluticasone (FLONASE) 50 MCG/ACT nasal spray Place 2 sprays into both nostrils daily.    . Fluticasone-Salmeterol (ADVAIR) 250-50 MCG/DOSE AEPB Inhale 1 puff into the lungs 2 (two) times daily.    Marland Kitchen  LORazepam (ATIVAN) 0.5 MG tablet Take 0.5 mg by mouth every 6 (six) hours as needed for anxiety.     Marland Kitchen losartan-hydrochlorothiazide (HYZAAR) 50-12.5 MG tablet Take 1 tablet by mouth daily as needed (elevated BP).     . mirtazapine (REMERON) 45 MG tablet Take 45 mg by mouth at bedtime.    . montelukast (SINGULAIR) 10 MG tablet Take 10 mg by mouth at bedtime.    Marland Kitchen oxyCODONE (OXY IR/ROXICODONE) 5 MG immediate release tablet Take 10 mg by mouth every 4 (four) hours as needed for severe pain.     . RABEprazole (ACIPHEX) 20 MG tablet Take 20 mg by mouth every morning.     . temazepam (RESTORIL)  30 MG capsule Take 30 mg by mouth at bedtime.     . topiramate (TOPAMAX) 100 MG tablet Take 100 mg by mouth at bedtime.     . VENTOLIN HFA 108 (90 Base) MCG/ACT inhaler Inhale 2 puffs into the lungs every 6 (six) hours as needed for wheezing or shortness of breath.     . betamethasone dipropionate (DIPROLENE) 0.05 % cream Apply 1 application topically daily as needed. For leg irritation  0  . butalbital-acetaminophen-caffeine (FIORICET, ESGIC) 50-325-40 MG tablet Take 1 tablet by mouth 2 (two) times daily as needed for headache.    . calcium-vitamin D (OSCAL WITH D) 500-200 MG-UNIT tablet Take 1 tablet by mouth 2 (two) times daily. 60 tablet 0    Results for orders placed or performed during the hospital encounter of 02/27/17 (from the past 48 hour(s))  Urinalysis, Routine w reflex microscopic     Status: Abnormal   Collection Time: 02/27/17 12:08 AM  Result Value Ref Range   Color, Urine YELLOW YELLOW   APPearance CLEAR CLEAR   Specific Gravity, Urine 1.025 1.005 - 1.030   pH 5.0 5.0 - 8.0   Glucose, UA NEGATIVE NEGATIVE mg/dL   Hgb urine dipstick NEGATIVE NEGATIVE   Bilirubin Urine NEGATIVE NEGATIVE   Ketones, ur NEGATIVE NEGATIVE mg/dL   Protein, ur NEGATIVE NEGATIVE mg/dL   Nitrite NEGATIVE NEGATIVE   Leukocytes, UA TRACE (A) NEGATIVE  Urinalysis, Microscopic (reflex)     Status: Abnormal   Collection Time: 02/27/17 12:08 AM  Result Value Ref Range   RBC / HPF 0-5 0 - 5 RBC/hpf   WBC, UA 0-5 0 - 5 WBC/hpf   Bacteria, UA RARE (A) NONE SEEN   Squamous Epithelial / LPF 0-5 (A) NONE SEEN   Hyaline Casts, UA PRESENT   Lipase, blood     Status: None   Collection Time: 02/27/17 10:42 PM  Result Value Ref Range   Lipase 32 11 - 51 U/L  Comprehensive metabolic panel     Status: Abnormal   Collection Time: 02/27/17 10:42 PM  Result Value Ref Range   Sodium 139 135 - 145 mmol/L   Potassium 4.0 3.5 - 5.1 mmol/L   Chloride 110 101 - 111 mmol/L   CO2 21 (L) 22 - 32 mmol/L   Glucose,  Bld 130 (H) 65 - 99 mg/dL   BUN 27 (H) 6 - 20 mg/dL   Creatinine, Ser 1.63 (H) 0.44 - 1.00 mg/dL   Calcium 8.8 (L) 8.9 - 10.3 mg/dL   Total Protein 7.1 6.5 - 8.1 g/dL   Albumin 3.7 3.5 - 5.0 g/dL   AST 18 15 - 41 U/L   ALT 14 14 - 54 U/L   Alkaline Phosphatase 111 38 - 126 U/L   Total Bilirubin 0.1 (  L) 0.3 - 1.2 mg/dL   GFR calc non Af Amer 32 (L) >60 mL/min   GFR calc Af Amer 37 (L) >60 mL/min    Comment: (NOTE) The eGFR has been calculated using the CKD EPI equation. This calculation has not been validated in all clinical situations. eGFR's persistently <60 mL/min signify possible Chronic Kidney Disease.    Anion gap 8 5 - 15  CBC     Status: Abnormal   Collection Time: 02/27/17 10:42 PM  Result Value Ref Range   WBC 11.4 (H) 4.0 - 10.5 K/uL   RBC 4.46 3.87 - 5.11 MIL/uL   Hemoglobin 13.2 12.0 - 15.0 g/dL   HCT 40.3 36.0 - 46.0 %   MCV 90.4 78.0 - 100.0 fL   MCH 29.6 26.0 - 34.0 pg   MCHC 32.8 30.0 - 36.0 g/dL   RDW 13.3 11.5 - 15.5 %   Platelets 263 150 - 400 K/uL  Differential     Status: Abnormal   Collection Time: 02/27/17 10:42 PM  Result Value Ref Range   Neutrophils Relative % 69 %   Neutro Abs 7.9 (H) 1.7 - 7.7 K/uL   Lymphocytes Relative 19 %   Lymphs Abs 2.2 0.7 - 4.0 K/uL   Monocytes Relative 10 %   Monocytes Absolute 1.2 (H) 0.1 - 1.0 K/uL   Eosinophils Relative 2 %   Eosinophils Absolute 0.3 0.0 - 0.7 K/uL   Basophils Relative 0 %   Basophils Absolute 0.0 0.0 - 0.1 K/uL   US Abdomen Limited Ruq  Result Date: 02/28/2017 CLINICAL DATA:  Acute onset of right upper quadrant abdominal pain. Leukocytosis and fever. Initial encounter. EXAM: US ABDOMEN LIMITED - RIGHT UPPER QUADRANT COMPARISON:  None. FINDINGS: Gallbladder: Mild gallbladder wall thickening is noted, with trace pericholecystic fluid. Stones measure up to 1.2 cm in size. A Murphy's sign was elicited by the ordering physician. Evaluation for ultrasonographic Murphy's sign is limited as the patient  is on pain medication. Common bile duct: Diameter: 0.6 cm, within normal limits in caliber. Liver: No focal lesion identified. Within normal limits in parenchymal echogenicity. IMPRESSION: Mild gallbladder wall thickening, with trace pericholecystic fluid and underlying stones. This is concerning for acute cholecystitis. Electronically Signed   By: Garald Balding M.D.   On: 02/28/2017 00:09    Review of Systems  Constitutional: Positive for malaise/fatigue. Negative for chills and fever.  HENT: Negative.   Eyes: Negative.   Respiratory: Negative.   Cardiovascular: Negative.   Gastrointestinal: Positive for abdominal pain, heartburn and nausea.  Genitourinary: Negative.   Musculoskeletal: Negative.   Skin: Negative for itching and rash.  Neurological: Negative.   Endo/Heme/Allergies: Negative.   Psychiatric/Behavioral: Negative.     Blood pressure (!) 142/66, pulse 66, temperature 98.8 F (37.1 C), temperature source Oral, resp. rate 18, height 5' (1.524 m), weight 88 kg (194 lb), SpO2 97 %. Physical Exam  Constitutional: She is oriented to person, place, and time. She appears well-developed and well-nourished.  HENT:  Head: Normocephalic.  Mouth/Throat: No oropharyngeal exudate.  Eyes: Pupils are equal, round, and reactive to light. No scleral icterus.  Neck: Normal range of motion. Neck supple.  Cardiovascular: Normal rate and regular rhythm.   Respiratory: Effort normal and breath sounds normal.  GI: There is tenderness in the right upper quadrant and epigastric area. There is positive Murphy's sign.  Musculoskeletal: Normal range of motion.  Neurological: She is alert and oriented to person, place, and time.  Skin: Skin is warm  and dry.  Psychiatric: She has a normal mood and affect. Her behavior is normal. Judgment and thought content normal.     Assessment/Plan Acute cholecystitis  For IV fluids, IV antibiotics and laparoscopic cholecystectomy today per the doctor of the  week service I discussed the procedure with her as well as potential risks and complications. She is eager to proceed. Long term expectations and postoperative recovery discussed the patient as well today. Jayelyn Barno A., MD 02/28/2017, 6:40 AM

## 2017-02-28 NOTE — Transfer of Care (Signed)
Immediate Anesthesia Transfer of Care Note  Patient: Dorian HeckleJannie Moomaw  Procedure(s) Performed: Procedure(s): LAPAROSCOPIC CHOLECYSTECTOMY (N/A)  Patient Location: PACU  Anesthesia Type:General  Level of Consciousness: sedated  Airway & Oxygen Therapy: Patient Spontanous Breathing and Patient connected to face mask oxygen  Post-op Assessment: Report given to RN and Post -op Vital signs reviewed and stable  Post vital signs: Reviewed and stable  Last Vitals:  Vitals:   02/28/17 0432 02/28/17 1056  BP: (!) 142/66 139/81  Pulse: 66 72  Resp: 18 16  Temp: 37.1 C 36.6 C    Last Pain:  Vitals:   02/28/17 1056  TempSrc: Oral  PainSc:       Patients Stated Pain Goal: 2 (02/28/17 0432)  Complications: No apparent anesthesia complications

## 2017-02-28 NOTE — Anesthesia Procedure Notes (Signed)
Procedure Name: Intubation Date/Time: 02/28/2017 1:39 PM Performed by: Lind Covert Pre-anesthesia Checklist: Patient identified, Emergency Drugs available, Suction available, Patient being monitored and Timeout performed Patient Re-evaluated:Patient Re-evaluated prior to inductionOxygen Delivery Method: Circle system utilized Preoxygenation: Pre-oxygenation with 100% oxygen Intubation Type: IV induction Laryngoscope Size: Mac and 3 Grade View: Grade I Tube type: Oral Tube size: 7.0 mm Number of attempts: 1 Airway Equipment and Method: Stylet Placement Confirmation: ETT inserted through vocal cords under direct vision,  positive ETCO2 and breath sounds checked- equal and bilateral Secured at: 21 cm

## 2017-02-28 NOTE — Interval H&P Note (Signed)
History and Physical Interval Note:  02/28/2017 10:28 AM  Melinda HeckleJannie Glaude  has presented today for surgery, with the diagnosis of cholelithiasis  The various methods of treatment have been discussed with the patient and family. After consideration of risks, benefits and other options for treatment, the patient has consented to  Procedure(s): LAPAROSCOPIC CHOLECYSTECTOMY WITH INTRAOPERATIVE CHOLANGIOGRAM (N/A) as a surgical intervention .  The patient's history has been reviewed, patient examined, no change in status, stable for surgery.  I have reviewed the patient's chart and labs.  Questions were answered to the patient's satisfaction.     Cameryn Schum Lollie SailsA Lynnett Langlinais

## 2017-03-01 ENCOUNTER — Encounter (HOSPITAL_COMMUNITY): Payer: Self-pay | Admitting: Surgery

## 2017-03-01 MED ORDER — OXYCODONE-ACETAMINOPHEN 5-325 MG PO TABS: 1.0000 | ORAL_TABLET | Freq: Four times a day (QID) | ORAL | 0 refills | Status: AC | PRN

## 2017-03-01 MED ORDER — MUPIROCIN 2 % EX OINT
TOPICAL_OINTMENT | Freq: Two times a day (BID) | CUTANEOUS | Status: DC
  Filled 2017-03-01: qty 22

## 2017-03-01 NOTE — Discharge Summary (Signed)
[facility] Discharge Summary   Patient ID: Melinda Berger MRN: [id number] DOB/AGE: 10-Jan-1951 66 y.o.  Admit date: 02/27/2017 Discharge date: 03/01/2017  Admitting Diagnosis: Acute cholecystitis  Discharge Diagnosis Patient Active Problem List   Diagnosis Date Noted  . Acute cholecystitis 02/28/2017  . Pleural effusion, bilateral   . CAP (community acquired pneumonia) 07/13/2015  . GERD (gastroesophageal reflux disease) 07/13/2015  . Dyslipidemia 07/13/2015  . Migraine 07/13/2015  . Insomnia 07/13/2015  . Early Sepsis (HCC) 07/13/2015  . Barrett's esophagus 10/07/2011    Class: Chronic  . Fracture of ulna, right, open 10/07/2011    Class: Hospitalized for  . Dog bite of forearm 10/07/2011    Class: Hospitalized for  . Multiple abrasions 10/07/2011    Class: Present on Admission  . GOITER, MULTINODULAR 04/28/2008  . HYPERTHYROIDISM 03/17/2008  . HYPERLIPIDEMIA 03/17/2008  . DEPRESSION 03/17/2008  . ASTHMA 03/17/2008  . GERD 03/17/2008  . CARDIAC MURMUR 03/17/2008    Consultants None  Imaging: [place] Abdomen Limited Ruq  Result Date: 02/28/2017 CLINICAL DATA:  Acute onset of right upper quadrant abdominal pain. Leukocytosis and fever. Initial encounter. EXAM: US ABDOMEN LIMITED - RIGHT UPPER QUADRANT COMPARISON:  None. FINDINGS: Gallbladder: Mild gallbladder wall thickening is noted, with trace pericholecystic fluid. Stones measure up to 1.2 cm in size. A Murphy's sign was elicited by the ordering physician. Evaluation for ultrasonographic Murphy's sign is limited as the patient is on pain medication. Common bile duct: Diameter: 0.6 cm, within normal limits in caliber. Liver: No focal lesion identified. Within normal limits in parenchymal echogenicity. IMPRESSION: Mild gallbladder wall thickening, with trace pericholecystic fluid and underlying stones. This is concerning for acute cholecystitis. Electronically Signed   By: Roanna RaiderJeffery  Chang M.D.   On: 02/28/2017  00:09    Procedures Dr. Fredricka Bonineonnor (02/28/17) - Laparoscopic Cholecystectomy  Hospital Course:  Melinda Berger is a 66yo female who [facility] with 1 day of severe epigastric and RUQ abdominal pain.  Workup showed thickened gallbladder wall with gallstones consistent with acute cholecystitis, and leukocytosis.  Patient was admitted and underwent procedure listed above.  Tolerated procedure well and was transferred to the floor.  Diet was advanced as tolerated.  On POD1 the patient was voiding well, tolerating diet, ambulating well, pain well controlled, vital signs stable, incisions c/d/i and felt stable for discharge home.  Patient will follow up in our office in 2-3 weeks and knows to call with questions or concerns.   I have personally reviewed the patients medication history on the [place] controlled substance database.    Physical Exam: General:  Alert, NAD, pleasant, comfortable Pulm: effort normal Abd:  Soft, ND, appropriately tender, multiple lap incisions C/D/I  Allergies as of 03/01/2017      Reactions   Amoxicillin-pot Clavulanate Diarrhea   Very severe diarrhea Has patient had a PCN reaction causing immediate rash, facial/tongue/throat swelling, SOB or lightheadedness with hypotension: no Has patient had a PCN reaction causing severe rash involving mucus membranes or skin necrosis:  no Has patient had a PCN reaction that required hospitalization: no Has patient had a PCN reaction occurring within the last 10 years:  no If all of the above answers are "NO", then may proceed with Cephalosporin use.   Nsaids Other (See Comments)   Due to barret esophagus   Sulfa Antibiotics Hives, Itching, Rash   Zolpidem Tartrate Other (See Comments)   Hallucinations      Medication List    STOP  taking these medications   oxyCODONE 5 MG immediate release tablet Commonly known as:  Oxy IR/ROXICODONE     TAKE these medications   albuterol (2.5 MG/3ML)  0.083% nebulizer solution Commonly known as:  PROVENTIL Inhale 3 mLs into the lungs daily as needed. For shortness of breath   VENTOLIN HFA 108 (90 Base) MCG/ACT inhaler Generic drug:  albuterol Inhale 2 puffs into the lungs every 6 (six) hours as needed for wheezing or shortness of breath.   betamethasone dipropionate 0.05 % cream Commonly known as:  DIPROLENE Apply 1 application topically daily as needed. For leg irritation   butalbital-acetaminophen-caffeine 50-325-40 MG tablet Commonly known as:  FIORICET, ESGIC Take 1 tablet by mouth 2 (two) times daily as needed for headache.   calcium-vitamin D 500-200 MG-UNIT tablet Commonly known as:  OSCAL WITH D Take 1 tablet by mouth 2 (two) times daily.   CARAFATE 1 GM/10ML suspension Generic drug:  sucralfate Take 10 mLs by mouth 4 (four) times daily as needed (for heartburn).   cefdinir 300 MG capsule Commonly known as:  OMNICEF Take 300 mg by mouth 2 (two) times daily.   cyclobenzaprine 10 MG tablet Commonly known as:  FLEXERIL Take 10 mg by mouth 3 (three) times daily as needed for muscle spasms.   fluticasone 50 MCG/ACT nasal spray Commonly known as:  FLONASE Place 2 sprays into both nostrils daily.   Fluticasone-Salmeterol 250-50 MCG/DOSE Aepb Commonly known as:  ADVAIR Inhale 1 puff into the lungs 2 (two) times daily.   LORazepam 0.5 MG tablet Commonly known as:  ATIVAN Take 0.5 mg by mouth every 6 (six) hours as needed for anxiety.   losartan-hydrochlorothiazide 50-12.5 MG tablet Commonly known as:  HYZAAR Take 1 tablet by mouth daily as needed (elevated BP).   mirtazapine 45 MG tablet Commonly known as:  REMERON Take 45 mg by mouth at bedtime.   montelukast 10 MG tablet Commonly known as:  SINGULAIR Take 10 mg by mouth at bedtime.   oxyCODONE-acetaminophen 5-325 MG tablet Commonly known as:  ROXICET Take 1 tablet by mouth every 6 (six) hours as needed.   RABEprazole 20 MG tablet Commonly known as:   ACIPHEX Take 20 mg by mouth every morning.   temazepam 30 MG capsule Commonly known as:  RESTORIL Take 30 mg by mouth at bedtime.   topiramate 100 MG tablet Commonly known as:  TOPAMAX Take 100 mg by mouth at bedtime.        Follow-up Information    [facility], [place]. Go on 03/23/2017.   Specialty:  General Surgery Why:  Your appointment is 03/23/17 at 2:45pm. Please arrive 30 minutes prior to your appointment to check in and fill out necessary paperwork. Contact information: [address] [place] [address] [phone]          Signed: Edson Snowball, [facility] 03/01/2017, 7:39 AM Pager: [phone] Consults: [phone] Mon-Fri 7:00 am-4:30 pm Sat-Sun 7:00 am-11:30 am
